# Patient Record
Sex: Male | Born: 1955 | Race: White | Hispanic: No | Marital: Married | Smoking: Never smoker
Health system: Southern US, Community
[De-identification: ages and names within clinical notes are randomized; demographics above are authoritative.]

## PROBLEM LIST (undated history)

## (undated) DIAGNOSIS — R000 Tachycardia, unspecified: Secondary | ICD-10-CM

## (undated) DIAGNOSIS — I429 Cardiomyopathy, unspecified: Secondary | ICD-10-CM

## (undated) DIAGNOSIS — R011 Cardiac murmur, unspecified: Secondary | ICD-10-CM

## (undated) DIAGNOSIS — T8859XA Other complications of anesthesia, initial encounter: Secondary | ICD-10-CM

## (undated) DIAGNOSIS — I10 Essential (primary) hypertension: Secondary | ICD-10-CM

## (undated) DIAGNOSIS — I2510 Atherosclerotic heart disease of native coronary artery without angina pectoris: Secondary | ICD-10-CM

## (undated) DIAGNOSIS — Z955 Presence of coronary angioplasty implant and graft: Secondary | ICD-10-CM

## (undated) DIAGNOSIS — I493 Ventricular premature depolarization: Secondary | ICD-10-CM

## (undated) HISTORY — PX: HEMORRHOID SURGERY: SHX153

---

## 2006-09-19 ENCOUNTER — Ambulatory Visit: Payer: Self-pay | Admitting: General Surgery

## 2006-09-25 ENCOUNTER — Ambulatory Visit: Payer: Self-pay | Admitting: General Surgery

## 2006-10-03 ENCOUNTER — Emergency Department: Payer: Self-pay | Admitting: General Surgery

## 2013-04-01 DIAGNOSIS — Z86018 Personal history of other benign neoplasm: Secondary | ICD-10-CM

## 2013-04-01 HISTORY — DX: Personal history of other benign neoplasm: Z86.018

## 2013-04-01 HISTORY — PX: HERNIA REPAIR: SHX51

## 2013-04-27 ENCOUNTER — Ambulatory Visit: Payer: Self-pay | Admitting: Surgery

## 2013-04-27 LAB — BASIC METABOLIC PANEL
ANION GAP: 4 — AB (ref 7–16)
BUN: 16 mg/dL (ref 7–18)
CHLORIDE: 107 mmol/L (ref 98–107)
CO2: 29 mmol/L (ref 21–32)
Calcium, Total: 8.8 mg/dL (ref 8.5–10.1)
Creatinine: 1 mg/dL (ref 0.60–1.30)
EGFR (African American): >60
EGFR (Non-African Amer.): >60
Glucose: 93 mg/dL (ref 65–99)
OSMOLALITY: 280 (ref 275–301)
Potassium: 3.7 mmol/L (ref 3.5–5.1)
Sodium: 140 mmol/L (ref 136–145)

## 2013-04-27 LAB — CBC
HCT: 42.2 % (ref 40.0–52.0)
HGB: 14.6 g/dL (ref 13.0–18.0)
MCH: 31.3 pg (ref 26.0–34.0)
MCHC: 34.5 g/dL (ref 32.0–36.0)
MCV: 91 fL (ref 80–100)
Platelet: 134 10*3/uL — ABNORMAL LOW (ref 150–440)
RBC: 4.66 10*6/uL (ref 4.40–5.90)
RDW: 12.8 % (ref 11.5–14.5)
WBC: 4.4 10*3/uL (ref 3.8–10.6)

## 2013-05-03 ENCOUNTER — Ambulatory Visit: Payer: Self-pay | Admitting: Surgery

## 2013-05-03 LAB — CBC WITH DIFFERENTIAL/PLATELET
BASOS ABS: 0 10*3/uL (ref 0.0–0.1)
Basophil %: 0.8 %
EOS ABS: 0.1 10*3/uL (ref 0.0–0.7)
EOS PCT: 1.2 %
HCT: 41.5 % (ref 40.0–52.0)
HGB: 14 g/dL (ref 13.0–18.0)
Lymphocyte #: 1.4 10*3/uL (ref 1.0–3.6)
Lymphocyte %: 26.3 %
MCH: 30.7 pg (ref 26.0–34.0)
MCHC: 33.8 g/dL (ref 32.0–36.0)
MCV: 91 fL (ref 80–100)
Monocyte #: 0.5 x10 3/mm (ref 0.2–1.0)
Monocyte %: 8.9 %
Neutrophil #: 3.4 10*3/uL (ref 1.4–6.5)
Neutrophil %: 62.8 %
Platelet: 145 10*3/uL — ABNORMAL LOW (ref 150–440)
RBC: 4.58 10*6/uL (ref 4.40–5.90)
RDW: 12.9 % (ref 11.5–14.5)
WBC: 5.4 10*3/uL (ref 3.8–10.6)

## 2013-05-03 LAB — BASIC METABOLIC PANEL
Anion Gap: 5 — ABNORMAL LOW (ref 7–16)
BUN: 14 mg/dL (ref 7–18)
CALCIUM: 8.2 mg/dL — AB (ref 8.5–10.1)
CHLORIDE: 108 mmol/L — AB (ref 98–107)
CO2: 27 mmol/L (ref 21–32)
Creatinine: 1.17 mg/dL (ref 0.60–1.30)
EGFR (Non-African Amer.): >60
GLUCOSE: 87 mg/dL (ref 65–99)
OSMOLALITY: 279 (ref 275–301)
POTASSIUM: 3.9 mmol/L (ref 3.5–5.1)
Sodium: 140 mmol/L (ref 136–145)

## 2013-05-03 LAB — TSH: Thyroid Stimulating Horm: 1.12 u[IU]/mL

## 2014-02-17 ENCOUNTER — Emergency Department: Payer: Self-pay | Admitting: Emergency Medicine

## 2014-07-23 NOTE — Consult Note (Signed)
PATIENT NAME:  Jose Mckenzie, Jose Mckenzie MR#:  [id number] DATE OF BIRTH:  04/01/1956  DATE OF CONSULTATION:  05/03/2013  REFERRING PHYSICIAN:  Rochel Brome, MD CONSULTING PHYSICIAN:  Zadin Lange D. Clayborn Bigness, MD  PRIMARY CARE PHYSICIAN: Juluis Pitch, MD  INDICATION: Arrhythmia, palpitations, multifocal PVCs, postop from hernia repair.   HISTORY OF PRESENT ILLNESS: The patient is a 59 year old white male with no significant past medical history. He has had hemorrhoids in the past. Recently was found to have a hernia. He underwent hernia repair with Dr. Rochel Brome; it was uneventful. Postoperatively he had what was found to be multifocal PVCs, numbering multiple. The patient denied any chest pain or palpitations. No recent blackout spells or weakness. He has had no significant cardiac history. Reportedly, he has had a Holter in the past which just showed significant PVCs. He has been in some pain postoperatively and with the anesthesia had some significant worsening PVCs. He had a stress test done back in June of 2013 which looked okay. The patient now feels fine postoperatively and is asymptomatic. Telemetry with occasional PVCs.   FAMILY HISTORY: Negative except for diabetes, hypertension, and prostate cancer.   SOCIAL HISTORY: Married. Lives with his wife. One child. Works as a Pharmacist, hospital at [employer]. In [place] and retired from the [employer]. Denies smoking or alcohol consumption.   PAST MEDICAL HISTORY: Hemorrhoids, otherwise negative.   PAST SURGICAL HISTORY: Hemorrhoidectomy.  REVIEW OF SYSTEMS: Essentially negative. No blackout spells or syncope. No nausea or vomiting. No fever. No chills or sweats. No weight loss. No weight gain, hemoptysis, or hematemesis. No bright red blood per rectum.  PHYSICAL EXAMINATION: VITAL SIGNS: Blood pressure 120/75, pulse 65, respiratory rate 18, afebrile.  HEENT: Normocephalic, atraumatic. Pupils equal and reactive to light.  NECK: Supple. No significant JVD, bruits,  or adenopathy.  LUNGS: Clear to auscultation and percussion. No significant wheeze, rhonchi, or rale.  HEART: Regular rate and rhythm. He had a slight murmur on heart exam, systolic, along the left sternal border. ABDOMEN: Positive bowel sounds. No rebound, guarding, or tenderness.  Abdomen otherwise benign.  EXTREMITIES: Within normal limits.  NEUROLOGIC: Intact.  SKIN: Normal.   LABORATORY AND DIAGNOSTICS: Laboratories today showed normal MET-B, normal CBC, and TSH is also normal.   EKG: Normal sinus rhythm, occasional frequent PVCs, otherwise negative.   IMPRESSION: Multifocal premature ventricular complexes postoperative from hernia surgery, mild pain, murmur.   PLAN: Will allow the patient to be discharged home. He got 2.5 Lopressor to see that would help the premature ventricular complexes. I do not recommend any direct medication changes at this point. Will not send him home on a beta blocker. Would consider aspirin once a day, 81 mg, for primary prevention. Would allow the patient to recover from his groin surgery. Would have him follow up in the office in a couple of weeks and then consider Holter monitor. Would also consider echocardiogram to be sure that the murmur is not anything significant. Would treat the patient conservatively and medically for now.   ____________________________ Loran Senters. Clayborn Bigness, MD ddc:sb D: 05/03/2013 12:18:57 ET T: 05/03/2013 13:47:23 ET JOB#: [id number]  cc: Draydon Clairmont D. Clayborn Bigness, MD, <Dictator> Yolonda Kida MD ELECTRONICALLY SIGNED 06/14/2013 7:48

## 2014-07-23 NOTE — Consult Note (Signed)
Brief Consult Note: Diagnosis: PVCs Palpitations Post op.   Patient was seen by consultant.   Consult note dictated.   Recommend to proceed with surgery or procedure.   Orders entered.   Discussed with Attending MD.   Comments: IMP PVCs Palpitions Murmur Post-op hernia . PLan Low dose Metoprolol IV ASA 81mg  QD Holter 24hr as outpt ECHO as outpt for murmur I do not rec ETT at this point F/U cardiology 1-2 weeks Ok to D/C home today.  Electronic Signatures: Jose Mckenzie, Keldric Poyer D (MD)  (Signed 02-Feb-15 12:27)  Authored: Brief Consult Note   Last Updated: 02-Feb-15 12:27 by Jose Mckenzie, Jose Mckenzie D (MD)

## 2014-07-23 NOTE — Op Note (Signed)
PATIENT NAME:  Jose Mckenzie, Elwyn MR#:  [id number] DATE OF BIRTH:  Aug 12, 1955  DATE OF PROCEDURE:  05/03/2013  PREOPERATIVE DIAGNOSIS: Right inguinal hernia.   POSTOPERATIVE DIAGNOSIS: Right inguinal hernia.   PROCEDURE: Right inguinal hernia repair.   SURGEON: Renda RollsWilton Alicianna Litchford, M.D.   ANESTHESIA: General.   INDICATIONS: This 59 year old male has had bulging in the right groin which resolves with being in the supine position. Did feel an impulse at this site on exam, making the diagnosis of right inguinal hernia and surgery was recommended for definitive treatment.   DESCRIPTION OF PROCEDURE: The patient was placed on the operating table in the supine position under general endotracheal anesthesia. The abdomen was prepared with ChloraPrep and draped in a sterile manner.   A right lower quadrant transversely oriented suprapubic incision was made, carried down through the subcutaneous tissues. One traversing vein was divided between 4-0 chromic suture ligatures. Scarpa's fascia was incised. The external ring was identified. The external oblique aponeurosis was incised along the course of its fibers to open the external ring and expose the inguinal cord structures. The ilioinguinal nerve was seen coursing medially and was dissected briefly to further move it medially. The cord structures were mobilized. There was just a small defect on the floor of the inguinal canal. The cremaster fibers were spread to expose an indirect hernia sac, which was dissected free from surrounding structures. The sac was approximately 4 cm in length. It was opened. Its continuity with the peritoneal cavity was demonstrated. A high ligation of the sac was done with 4-0 Vicryl suture ligature, and the sac was excised. I did not elect to send it for pathology. Next, a small, direct component was imbricated with 0 Surgilon simple sutures. Next, an onlay Bard soft polypropylene mesh was cut to create an oval shape of some 2.5 x 3.5 cm with  a notch cut out to straddle the internal ring. This was sutured to the deep fascia and did cover the small direct defect and was sutured to the shelving edge of the inguinal ligament and also sutured medially to the fascia but just lateral to the course of the ilioinguinal nerve. The repair looked good. Hemostasis was intact. Cord structures were replaced along the floor of the inguinal canal. Cut edges of the external oblique aponeurosis were closed with a running 4-0 Vicryl suture to recreate the external ring. The deep fascia superior and lateral to the repair site was infiltrated with 0.5% Sensorcaine with epinephrine. The subcutaneous tissues were infiltrated as well. The Scarpa's fascia was closed with interrupted 4-0 Vicryl. The skin was closed with running 5-0 Monocryl subcuticular suture and Dermabond. The testicle remained in the scrotum. The patient tolerated surgery satisfactorily and was then prepared for transfer to the recovery room.     ____________________________ Shela CommonsJ. Renda RollsWilton Kayleanna Lorman, MD jws:dmm D: 05/03/2013 09:07:42 ET T: 05/03/2013 11:45:52 ET JOB#: [id number]  cc: Adella HareJ. Wilton Dorothye Berni, MD, <Dictator> Adella HareWILTON J Evagelia Knack MD ELECTRONICALLY SIGNED 05/04/2013 9:18

## 2021-10-15 ENCOUNTER — Other Ambulatory Visit
Admission: RE | Admit: 2021-10-15 | Discharge: 2021-10-15 | Disposition: A | Discharge status: Home / Self Care | Payer: BC Managed Care – PPO | Source: Ambulatory Visit | Attending: Internal Medicine | Admitting: Internal Medicine

## 2021-10-15 DIAGNOSIS — R0602 Shortness of breath: Secondary | ICD-10-CM | POA: Diagnosis not present

## 2021-10-15 DIAGNOSIS — I493 Ventricular premature depolarization: Secondary | ICD-10-CM | POA: Diagnosis not present

## 2021-10-15 DIAGNOSIS — I447 Left bundle-branch block, unspecified: Secondary | ICD-10-CM | POA: Insufficient documentation

## 2021-10-15 DIAGNOSIS — R002 Palpitations: Secondary | ICD-10-CM | POA: Diagnosis present

## 2021-10-15 LAB — BRAIN NATRIURETIC PEPTIDE: B Natriuretic Peptide: 28.8 pg/mL (ref 0.0–100.0)

## 2021-11-01 ENCOUNTER — Other Ambulatory Visit: Payer: Self-pay

## 2021-11-01 ENCOUNTER — Encounter
Admission: RE | Disposition: A | Discharge status: Home / Self Care | Payer: Self-pay | Source: Home / Self Care | Attending: Internal Medicine

## 2021-11-01 ENCOUNTER — Encounter: Payer: Self-pay | Admitting: Internal Medicine

## 2021-11-01 ENCOUNTER — Observation Stay
Admission: RE | Admit: 2021-11-01 | Discharge: 2021-11-03 | Disposition: A | Discharge status: Home / Self Care | Payer: BC Managed Care – PPO | Attending: Internal Medicine | Admitting: Internal Medicine

## 2021-11-01 DIAGNOSIS — I255 Ischemic cardiomyopathy: Secondary | ICD-10-CM | POA: Diagnosis not present

## 2021-11-01 DIAGNOSIS — I10 Essential (primary) hypertension: Secondary | ICD-10-CM | POA: Insufficient documentation

## 2021-11-01 DIAGNOSIS — Z955 Presence of coronary angioplasty implant and graft: Secondary | ICD-10-CM

## 2021-11-01 DIAGNOSIS — I771 Stricture of artery: Secondary | ICD-10-CM | POA: Insufficient documentation

## 2021-11-01 DIAGNOSIS — I25110 Atherosclerotic heart disease of native coronary artery with unstable angina pectoris: Secondary | ICD-10-CM | POA: Diagnosis present

## 2021-11-01 DIAGNOSIS — I447 Left bundle-branch block, unspecified: Secondary | ICD-10-CM | POA: Diagnosis not present

## 2021-11-01 DIAGNOSIS — R9430 Abnormal result of cardiovascular function study, unspecified: Secondary | ICD-10-CM

## 2021-11-01 DIAGNOSIS — E785 Hyperlipidemia, unspecified: Secondary | ICD-10-CM | POA: Insufficient documentation

## 2021-11-01 HISTORY — PX: LEFT HEART CATH AND CORONARY ANGIOGRAPHY: CATH118249

## 2021-11-01 HISTORY — PX: CORONARY STENT INTERVENTION: CATH118234

## 2021-11-01 LAB — POCT ACTIVATED CLOTTING TIME: Activated Clotting Time: 287 seconds

## 2021-11-01 SURGERY — LEFT HEART CATH AND CORONARY ANGIOGRAPHY
Anesthesia: Moderate Sedation

## 2021-11-01 MED ORDER — ASPIRIN 81 MG PO CHEW
CHEWABLE_TABLET | ORAL | Status: AC
  Administered 2021-11-01: 81 mg via ORAL
  Filled 2021-11-01: qty 1

## 2021-11-01 MED ORDER — LIDOCAINE HCL 1 % IJ SOLN
INTRAMUSCULAR | Status: AC
  Filled 2021-11-01: qty 20

## 2021-11-01 MED ORDER — SPIRONOLACTONE 25 MG PO TABS
12.5000 mg | ORAL_TABLET | Freq: Every day | ORAL | Status: DC
  Administered 2021-11-01 – 2021-11-03 (×3): 12.5 mg via ORAL
  Filled 2021-11-01: qty 1
  Filled 2021-11-01 (×3): qty 0.5
  Filled 2021-11-01: qty 1

## 2021-11-01 MED ORDER — SODIUM CHLORIDE 0.9% FLUSH
3.0000 mL | Freq: Two times a day (BID) | INTRAVENOUS | Status: DC
  Administered 2021-11-02 – 2021-11-03 (×3): 3 mL via INTRAVENOUS

## 2021-11-01 MED ORDER — METOPROLOL SUCCINATE ER 50 MG PO TB24
ORAL_TABLET | ORAL | Status: AC
  Administered 2021-11-01: 25 mg via ORAL
  Filled 2021-11-01: qty 1

## 2021-11-01 MED ORDER — HYDRALAZINE HCL 20 MG/ML IJ SOLN
INTRAMUSCULAR | Status: AC
  Filled 2021-11-01: qty 1

## 2021-11-01 MED ORDER — ACETAMINOPHEN 325 MG PO TABS: 650.0000 mg | ORAL_TABLET | ORAL | Status: DC | PRN

## 2021-11-01 MED ORDER — LIDOCAINE HCL (PF) 1 % IJ SOLN
INTRAMUSCULAR | Status: DC | PRN
  Administered 2021-11-01: 3 mL

## 2021-11-01 MED ORDER — SODIUM CHLORIDE 0.9 % IV SOLN: 250.0000 mL | INTRAVENOUS | Status: DC | PRN

## 2021-11-01 MED ORDER — VERAPAMIL HCL 2.5 MG/ML IV SOLN
INTRAVENOUS | Status: AC
  Filled 2021-11-01: qty 2

## 2021-11-01 MED ORDER — MIDAZOLAM HCL 2 MG/2ML IJ SOLN
INTRAMUSCULAR | Status: DC | PRN
  Administered 2021-11-01: 1 mg via INTRAVENOUS

## 2021-11-01 MED ORDER — TICAGRELOR 90 MG PO TABS
ORAL_TABLET | ORAL | Status: AC
  Filled 2021-11-01: qty 2

## 2021-11-01 MED ORDER — VERAPAMIL HCL 2.5 MG/ML IV SOLN
INTRAVENOUS | Status: DC | PRN
  Administered 2021-11-01: 2.5 mg via INTRA_ARTERIAL

## 2021-11-01 MED ORDER — TICAGRELOR 90 MG PO TABS
ORAL_TABLET | ORAL | Status: DC | PRN
  Administered 2021-11-01: 180 mg via ORAL

## 2021-11-01 MED ORDER — ASPIRIN 81 MG PO CHEW
CHEWABLE_TABLET | ORAL | Status: AC
  Filled 2021-11-01: qty 4

## 2021-11-01 MED ORDER — HEPARIN SODIUM (PORCINE) 1000 UNIT/ML IJ SOLN
INTRAMUSCULAR | Status: AC
  Filled 2021-11-01: qty 10

## 2021-11-01 MED ORDER — IOHEXOL 300 MG/ML  SOLN
INTRAMUSCULAR | Status: DC | PRN
  Administered 2021-11-01: 400 mL

## 2021-11-01 MED ORDER — ASPIRIN 81 MG PO CHEW: 81.0000 mg | CHEWABLE_TABLET | ORAL | Status: AC

## 2021-11-01 MED ORDER — SODIUM CHLORIDE 0.9 % WEIGHT BASED INFUSION
1.0000 mL/kg/h | INTRAVENOUS | Status: AC
  Administered 2021-11-01: 1 mL/kg/h via INTRAVENOUS

## 2021-11-01 MED ORDER — HEPARIN SODIUM (PORCINE) 1000 UNIT/ML IJ SOLN
INTRAMUSCULAR | Status: DC | PRN
  Administered 2021-11-01: 3000 [IU] via INTRAVENOUS
  Administered 2021-11-01: 6000 [IU] via INTRAVENOUS
  Administered 2021-11-01: 4000 [IU] via INTRAVENOUS

## 2021-11-01 MED ORDER — TICAGRELOR 90 MG PO TABS
90.0000 mg | ORAL_TABLET | Freq: Two times a day (BID) | ORAL | Status: DC
  Administered 2021-11-02 – 2021-11-03 (×3): 90 mg via ORAL
  Filled 2021-11-01 (×3): qty 1

## 2021-11-01 MED ORDER — ONDANSETRON HCL 4 MG/2ML IJ SOLN: 4.0000 mg | Freq: Four times a day (QID) | INTRAMUSCULAR | Status: DC | PRN

## 2021-11-01 MED ORDER — MIDAZOLAM HCL 2 MG/2ML IJ SOLN
INTRAMUSCULAR | Status: AC
  Filled 2021-11-01: qty 2

## 2021-11-01 MED ORDER — LABETALOL HCL 5 MG/ML IV SOLN
10.0000 mg | INTRAVENOUS | Status: AC | PRN
Start: 2021-11-01 — End: 2021-11-02
  Administered 2021-11-01: 10 mg via INTRAVENOUS

## 2021-11-01 MED ORDER — HEPARIN (PORCINE) IN NACL 1000-0.9 UT/500ML-% IV SOLN
INTRAVENOUS | Status: DC | PRN
  Administered 2021-11-01: 1000 mL

## 2021-11-01 MED ORDER — FENTANYL CITRATE (PF) 100 MCG/2ML IJ SOLN
INTRAMUSCULAR | Status: AC
  Filled 2021-11-01: qty 2

## 2021-11-01 MED ORDER — SODIUM CHLORIDE 0.9% FLUSH: 3.0000 mL | INTRAVENOUS | Status: DC | PRN

## 2021-11-01 MED ORDER — METOPROLOL SUCCINATE ER 25 MG PO TB24
25.0000 mg | ORAL_TABLET | Freq: Every day | ORAL | Status: DC
  Administered 2021-11-02: 25 mg via ORAL
  Filled 2021-11-01 (×2): qty 1

## 2021-11-01 MED ORDER — SODIUM CHLORIDE 0.9 % WEIGHT BASED INFUSION
3.0000 mL/kg/h | INTRAVENOUS | Status: AC
  Administered 2021-11-01: 3 mL/kg/h via INTRAVENOUS

## 2021-11-01 MED ORDER — ASPIRIN 81 MG PO CHEW
CHEWABLE_TABLET | ORAL | Status: DC | PRN
  Administered 2021-11-01: 243 mg via ORAL

## 2021-11-01 MED ORDER — SODIUM CHLORIDE 0.9 % WEIGHT BASED INFUSION
1.0000 mL/kg/h | INTRAVENOUS | Status: DC
Start: 2021-11-01 — End: 2021-11-01
  Administered 2021-11-01: 1 mL/kg/h via INTRAVENOUS

## 2021-11-01 MED ORDER — HYDRALAZINE HCL 20 MG/ML IJ SOLN
10.0000 mg | INTRAMUSCULAR | Status: AC | PRN
Start: 2021-11-01 — End: 2021-11-02
  Administered 2021-11-01: 10 mg via INTRAVENOUS

## 2021-11-01 MED ORDER — ASPIRIN 81 MG PO CHEW
81.0000 mg | CHEWABLE_TABLET | Freq: Every day | ORAL | Status: DC
  Administered 2021-11-02 – 2021-11-03 (×2): 81 mg via ORAL
  Filled 2021-11-01 (×2): qty 1

## 2021-11-01 MED ORDER — LABETALOL HCL 5 MG/ML IV SOLN
INTRAVENOUS | Status: AC
  Administered 2021-11-01: 10 mg via INTRAVENOUS
  Filled 2021-11-01: qty 4

## 2021-11-01 MED ORDER — LOSARTAN POTASSIUM 50 MG PO TABS
50.0000 mg | ORAL_TABLET | Freq: Every day | ORAL | Status: DC
  Administered 2021-11-01 – 2021-11-03 (×3): 50 mg via ORAL
  Filled 2021-11-01 (×3): qty 1

## 2021-11-01 MED ORDER — FENTANYL CITRATE (PF) 100 MCG/2ML IJ SOLN
INTRAMUSCULAR | Status: DC | PRN
  Administered 2021-11-01: 50 ug via INTRAVENOUS

## 2021-11-01 MED ORDER — ROSUVASTATIN CALCIUM 10 MG PO TABS
40.0000 mg | ORAL_TABLET | Freq: Every day | ORAL | Status: DC
  Administered 2021-11-01 – 2021-11-03 (×3): 40 mg via ORAL
  Filled 2021-11-01: qty 2
  Filled 2021-11-01 (×2): qty 4

## 2021-11-01 SURGICAL SUPPLY — 19 items
BALLN EUPHORA RX 2.0X12 (BALLOONS) ×2
BALLOON EUPHORA RX 2.0X12 (BALLOONS) IMPLANT
BAND ZEPHYR COMPRESS 30 LONG (HEMOSTASIS) ×1 IMPLANT
CATH 5FR JL3.5 JR4 ANG PIG MP (CATHETERS) ×1 IMPLANT
CATH VISTA GUIDE 6FR XB3.5 (CATHETERS) ×1 IMPLANT
DRAPE BRACHIAL (DRAPES) ×1 IMPLANT
GLIDESHEATH SLEND SS 6F .021 (SHEATH) ×1 IMPLANT
GUIDEWIRE INQWIRE 1.5J.035X260 (WIRE) IMPLANT
INQWIRE 1.5J .035X260CM (WIRE) ×2
KIT ENCORE 26 ADVANTAGE (KITS) ×1 IMPLANT
PACK CARDIAC CATH (CUSTOM PROCEDURE TRAY) ×2 IMPLANT
PROTECTION STATION PRESSURIZED (MISCELLANEOUS) ×2
SET ATX SIMPLICITY (MISCELLANEOUS) ×1 IMPLANT
STATION PROTECTION PRESSURIZED (MISCELLANEOUS) IMPLANT
STENT ONYX FRONTIER 2.0X15 (Permanent Stent) ×1 IMPLANT
STENT ONYX FRONTIER 2.5X08 (Permanent Stent) ×1 IMPLANT
STENT ONYX FRONTIER 2.5X18 (Permanent Stent) ×1 IMPLANT
TUBING CIL FLEX 10 FLL-RA (TUBING) ×1 IMPLANT
WIRE G HI TQ BMW 190 (WIRE) ×2 IMPLANT

## 2021-11-01 NOTE — Progress Notes (Signed)
Called Dr Juliann Pares and apprised of hematoma size and quality and pt's reports of "numbness" and discoloration to hand - pt fingers O2 sat 97% and blanches with return to color.  Orders are to keep band inflated to initial pressure (14 ml air) and in place on wrist, Dr Juliann Pares reported intention to come in and assess pt's wrist, priority is hemostasis.

## 2021-11-01 NOTE — Progress Notes (Signed)
Notified MD of pt's TR band bleeding when taking air out of it. TR band down to 11cc. Rn also made MD aware part blow the thumb is purplish. MD instructed Rn to place 13-14 cc of air back in TR and an monitor circulation, and to notify if any changes.

## 2021-11-01 NOTE — Progress Notes (Signed)
Spoke with Dr. Juliann Pares. Patient has maintained hemostasis for 30 minutes. Per Dr. Juliann Pares start deflating TR Band.

## 2021-11-02 DIAGNOSIS — I25110 Atherosclerotic heart disease of native coronary artery with unstable angina pectoris: Secondary | ICD-10-CM | POA: Diagnosis not present

## 2021-11-02 LAB — CBC
HCT: 41.7 % (ref 39.0–52.0)
Hemoglobin: 14.6 g/dL (ref 13.0–17.0)
MCH: 30.5 pg (ref 26.0–34.0)
MCHC: 35 g/dL (ref 30.0–36.0)
MCV: 87.2 fL (ref 80.0–100.0)
Platelets: 152 10*3/uL (ref 150–400)
RBC: 4.78 MIL/uL (ref 4.22–5.81)
RDW: 12.4 % (ref 11.5–15.5)
WBC: 6.3 10*3/uL (ref 4.0–10.5)
nRBC: 0 % (ref 0.0–0.2)

## 2021-11-02 LAB — BASIC METABOLIC PANEL
Anion gap: 5 (ref 5–15)
BUN: 16 mg/dL (ref 8–23)
CO2: 24 mmol/L (ref 22–32)
Calcium: 8.5 mg/dL — ABNORMAL LOW (ref 8.9–10.3)
Chloride: 111 mmol/L (ref 98–111)
Creatinine, Ser: 1 mg/dL (ref 0.61–1.24)
GFR, Estimated: >60 mL/min (ref >60)
Glucose, Bld: 92 mg/dL (ref 70–99)
Potassium: 3.6 mmol/L (ref 3.5–5.1)
Sodium: 140 mmol/L (ref 135–145)

## 2021-11-02 NOTE — TOC Initial Note (Signed)
Transition of Care [facility]) - Initial/Assessment Note    Patient Details  Name: Jose Mckenzie MRN: [id number] Date of Birth: July 06, 1955  Transition of Care [facility]) CM/SW Contact:    Truddie Hidden, RN Phone Number: 11/02/2021, 10:32 AM  Clinical Narrative:                  Transition of Care [facility]) Screening Note   Patient Details  Name: Jose Mckenzie Date of Birth: 07/17/1955   Transition of Care [facility]) CM/SW Contact:    Truddie Hidden, RN Phone Number: 11/02/2021, 10:32 AM    Transition of Care Department [facility]) has reviewed patient and no TOC needs have been identified at this time. We will continue to monitor patient advancement through interdisciplinary progression rounds. If new patient transition needs arise, please place a TOC consult.          Patient Goals and CMS Choice        Expected Discharge Plan and Services                                                Prior Living Arrangements/Services                       Activities of Daily Living Home Assistive Devices/Equipment: None ADL Screening (condition at time of admission) Patient's cognitive ability adequate to safely complete daily activities?: Yes Is the patient deaf or have difficulty hearing?: No Does the patient have difficulty seeing, even when wearing glasses/contacts?: No Does the patient have difficulty concentrating, remembering, or making decisions?: No Patient able to express need for assistance with ADLs?: Yes Does the patient have difficulty dressing or bathing?: No Independently performs ADLs?: Yes (appropriate for developmental age) Does the patient have difficulty walking or climbing stairs?: No Weakness of Legs: None Weakness of Arms/Hands: None  Permission Sought/Granted                  Emotional Assessment              Admission diagnosis:  Status post insertion of drug eluting coronary artery stent [Z95.5] Patient Active Problem List   Diagnosis  Date Noted   Status post insertion of drug eluting coronary artery stent 11/01/2021   PCP:  Pcp, No Pharmacy:  No Pharmacies Listed    Social Determinants of Health (SDOH) Interventions    Readmission Risk Interventions     No data to display

## 2021-11-02 NOTE — Progress Notes (Signed)
[facility]    SUBJECTIVE: Patient doing much better right wrist is improving still some bruising no bleeding no numbness good sensation good capillary refill normal strength no chest pain no shortness of breath feels much improved   Vitals:   11/02/21 0800 11/02/21 0900 11/02/21 1130 11/02/21 1545  BP: (!) 145/91 (!) 137/91 135/83 130/88  Pulse: (!) 58 (!) 59 60 (!) 55  Resp: 13  20 19   Temp: 98.8 F (37.1 C)  98.7 F (37.1 C) 98 F (36.7 C)  TempSrc: Oral  Oral Oral  SpO2: 98% 99% 100% 97%  Weight:      Height:         Intake/Output Summary (Last 24 hours) at 11/02/2021 1923 Last data filed at 11/02/2021 1610 Gross per 24 hour  Intake 240 ml  Output 2625 ml  Net -2385 ml      PHYSICAL EXAM  General: Well developed, well nourished, in no acute distress HEENT:  Normocephalic and atramatic Neck:  No JVD.  Lungs: Clear bilaterally to auscultation and percussion. Heart: HRRR . Normal S1 and S2 without gallops or murmurs.  Abdomen: Bowel sounds are positive, abdomen soft and non-tender  Msk:  Back normal, normal gait. Normal strength and tone for age. Extremities: No clubbing, cyanosis or edema.   Neuro: Alert and oriented X 3. Psych:  Good affect, responds appropriately   LABS: Basic Metabolic Panel: Recent Labs    11/02/21 0556  NA 140  K 3.6  CL 111  CO2 24  GLUCOSE 92  BUN 16  CREATININE 1.00  CALCIUM 8.5*   Liver Function Tests: No results for input(s): "AST", "ALT", "ALKPHOS", "BILITOT", "PROT", "ALBUMIN" in the last 72 hours. No results for input(s): "LIPASE", "AMYLASE" in the last 72 hours. CBC: Recent Labs    11/02/21 0556  WBC 6.3  HGB 14.6  HCT 41.7  MCV 87.2  PLT 152   Cardiac Enzymes: No results for input(s): "CKTOTAL", "CKMB", "CKMBINDEX", "TROPONINI" in the last 72 hours. BNP: Invalid input(s): "POCBNP" D-Dimer: No results for input(s): "DDIMER" in the last 72 hours. Hemoglobin A1C: No results for input(s): "HGBA1C" in the last  72 hours. Fasting Lipid Panel: No results for input(s): "CHOL", "HDL", "LDLCALC", "TRIG", "CHOLHDL", "LDLDIRECT" in the last 72 hours. Thyroid Function Tests: No results for input(s): "TSH", "T4TOTAL", "T3FREE", "THYROIDAB" in the last 72 hours.  Invalid input(s): "FREET3" Anemia Panel: No results for input(s): "VITAMINB12", "FOLATE", "FERRITIN", "TIBC", "IRON", "RETICCTPCT" in the last 72 hours.  No results found.   Echo pending  TELEMETRY: Telemetry independently reviewed by me suggests sinus bradycardia rate around 55 left bundle branch block:  ASSESSMENT AND PLAN:  Principal Problem:   Status post insertion of drug eluting coronary artery stent Coronary artery disease Hypertension Right wrist hematoma Hyperlipidemia Mild ischemic cardiomyopathy    Plan Continue to observe overnight because of his right wrist bleeding and hematoma Continue Brilinta aspirin post PCI and stent for 12 months Hypertension control hopefully with ARB and beta-blocker consider spironolactone Recommend cardiac rehab Follow-up left ventricular function in 90 days after PCI and stent Increase activity have the patient ambulate in the halls Anticipate discharge in the morning  01/02/22, MD 11/02/2021 7:23 PM

## 2021-11-03 ENCOUNTER — Other Ambulatory Visit: Payer: Self-pay | Admitting: Internal Medicine

## 2021-11-03 DIAGNOSIS — I25110 Atherosclerotic heart disease of native coronary artery with unstable angina pectoris: Secondary | ICD-10-CM | POA: Diagnosis not present

## 2021-11-03 LAB — LIPOPROTEIN A (LPA): Lipoprotein (a): 23.9 nmol/L (ref <75.0)

## 2021-11-03 MED ORDER — ROSUVASTATIN CALCIUM 40 MG PO TABS: 40.0000 mg | ORAL_TABLET | Freq: Every day | ORAL | 0 refills | Status: DC

## 2021-11-03 MED ORDER — METOPROLOL SUCCINATE ER 25 MG PO TB24: 25.0000 mg | ORAL_TABLET | Freq: Every day | ORAL | 11 refills | Status: AC

## 2021-11-03 MED ORDER — ASPIRIN 81 MG PO TBEC: 81.0000 mg | DELAYED_RELEASE_TABLET | Freq: Every day | ORAL | 3 refills | Status: AC

## 2021-11-03 MED ORDER — LOSARTAN POTASSIUM 50 MG PO TABS: 50.0000 mg | ORAL_TABLET | Freq: Every day | ORAL | 3 refills | Status: DC

## 2021-11-03 MED ORDER — SPIRONOLACTONE 25 MG PO TABS: 12.5000 mg | ORAL_TABLET | Freq: Every day | ORAL | 3 refills | Status: DC

## 2021-11-03 MED ORDER — METOPROLOL SUCCINATE ER 25 MG PO TB24: 25.0000 mg | ORAL_TABLET | Freq: Every day | ORAL | 0 refills | Status: DC

## 2021-11-03 MED ORDER — TICAGRELOR 90 MG PO TABS: 90.0000 mg | ORAL_TABLET | Freq: Two times a day (BID) | ORAL | 0 refills | Status: DC

## 2021-11-03 MED ORDER — TICAGRELOR 90 MG PO TABS: 90.0000 mg | ORAL_TABLET | Freq: Two times a day (BID) | ORAL | 11 refills | Status: DC

## 2021-11-03 MED ORDER — METOPROLOL SUCCINATE ER 25 MG PO TB24: 25.0000 mg | ORAL_TABLET | Freq: Every day | ORAL | 11 refills | Status: DC

## 2021-11-03 MED ORDER — ROSUVASTATIN CALCIUM 20 MG PO TABS: 40.0000 mg | ORAL_TABLET | Freq: Every day | ORAL | 3 refills | Status: DC

## 2021-11-03 MED ORDER — LOSARTAN POTASSIUM 50 MG PO TABS: 50.0000 mg | ORAL_TABLET | Freq: Every day | ORAL | 0 refills | Status: DC

## 2021-11-03 MED ORDER — LOSARTAN POTASSIUM 50 MG PO TABS: 50.0000 mg | ORAL_TABLET | Freq: Every day | ORAL | 11 refills | Status: AC

## 2021-11-03 MED ORDER — ROSUVASTATIN CALCIUM 20 MG PO TABS: 40.0000 mg | ORAL_TABLET | Freq: Every day | ORAL | 11 refills | Status: AC

## 2021-11-03 MED ORDER — ASPIRIN 81 MG PO CHEW: 81.0000 mg | CHEWABLE_TABLET | Freq: Every day | ORAL | 0 refills | Status: DC

## 2021-11-03 MED ORDER — ASPIRIN 81 MG PO TBEC: 81.0000 mg | DELAYED_RELEASE_TABLET | Freq: Every day | ORAL | 3 refills | Status: DC

## 2021-11-03 MED ORDER — SPIRONOLACTONE 25 MG PO TABS: 12.5000 mg | ORAL_TABLET | Freq: Every day | ORAL | 0 refills | Status: DC

## 2021-11-03 NOTE — Discharge Summary (Signed)
Physician Discharge Summary      Patient ID: Jose Mckenzie MRN: [id number] DOB/AGE: 1955-12-11 66 y.o.  Admit date: 11/01/2021 Discharge date: 11/03/2021  Primary Discharge Diagnosis unstable angina Secondary Discharge Diagnosis multivessel coronary disease status post PCI and stent x2  Significant Diagnostic Studies: Cardiac cath PCI and stent  DES stent to mid LAD x2 DES stent to ramus  Consults:   Hospital Course: Patient initially presented as an outpatient with unstable angina diagnostic cardiac cath he underwent procedure right radial approach was found to have mildly depressed left ventricular function EF around 45% patient was found to have multivessel coronary disease and ramus and LAD where he received 2 DES stents in the LAD and 1 DES stent in the ramus with good results.  The patient was treated with heparin Brilinta aspirin had a TR band in the right wrist but there was some postprocedural bleeding requiring him to maintain the TR band overnight before was subsequently removed patient's had some mild ecchymosis under the skin with good function normal pulse.  Patient denies any chest pain feels reasonably well.  Because of his wrist it required him to stay an extra day for evaluation and management   Discharge Exam: Blood pressure 126/85, pulse (!) 55, temperature 97.8 F (36.6 C), temperature source Oral, resp. rate 18, height 5\' 9"  (1.753 m), weight 80.7 kg, SpO2 95 %.    General appearance: appears stated age Resp: clear to auscultation bilaterally Cardio: regular rate and rhythm, S1, S2 normal, no murmur, click, rub or gallop Extremities: extremities normal, atraumatic, no cyanosis or edema Pulses: 2+ and symmetric Neurologic: Alert and oriented X 3, normal strength and tone. Normal symmetric reflexes. Normal coordination and gait Right radial area significant healing hematoma ecchymosis good pulse good function healing well Labs:   Lab Results  Component Value  Date   WBC 6.3 11/02/2021   HGB 14.6 11/02/2021   HCT 41.7 11/02/2021   MCV 87.2 11/02/2021   PLT 152 11/02/2021    Recent Labs  Lab 11/02/21 0556  NA 140  K 3.6  CL 111  CO2 24  BUN 16  CREATININE 1.00  CALCIUM 8.5*  GLUCOSE 92      Radiology:  EKG: Sinus bradycardia left bundle branch block rate of 55 nonspecific ST-T wave changes  FOLLOW UP PLANS AND APPOINTMENTS Discharge Instructions     AMB Referral to Cardiac Rehabilitation - Phase II   Complete by: As directed    Diagnosis: Coronary Stents   After initial evaluation and assessments completed: Virtual Based Care may be provided alone or in conjunction with Phase 2 Cardiac Rehab based on patient barriers.: Yes      Allergies as of 11/03/2021   No Known Allergies      Medication List     TAKE these medications    aspirin 81 MG chewable tablet Chew 1 tablet (81 mg total) by mouth daily. Start taking on: November 04, 2021   losartan 50 MG tablet Commonly known as: COZAAR Take 1 tablet (50 mg total) by mouth daily. Start taking on: November 04, 2021   metoprolol succinate 25 MG 24 hr tablet Commonly known as: TOPROL-XL Take 1 tablet (25 mg total) by mouth daily. Start taking on: November 04, 2021   rosuvastatin 40 MG tablet Commonly known as: CRESTOR Take 1 tablet (40 mg total) by mouth daily. Start taking on: November 04, 2021   spironolactone 25 MG tablet Commonly known as: ALDACTONE Take 0.5 tablets (12.5 mg total)  by mouth daily. Start taking on: November 04, 2021   ticagrelor 90 MG Tabs tablet Commonly known as: BRILINTA Take 1 tablet (90 mg total) by mouth 2 (two) times daily.       Have the patient follow-up with cardiology 1 to 2 weeks   BRING ALL MEDICATIONS WITH YOU TO FOLLOW UP APPOINTMENTS  Time spent with patient to include physician time: 25 min Signed:  Alwyn Pea MD 11/03/2021, 11:14 AM

## 2021-11-05 ENCOUNTER — Encounter: Payer: Self-pay | Admitting: Internal Medicine

## 2021-11-11 ENCOUNTER — Encounter: Payer: Self-pay | Admitting: Emergency Medicine

## 2021-11-11 ENCOUNTER — Emergency Department
Admission: EM | Admit: 2021-11-11 | Discharge: 2021-11-11 | Disposition: A | Discharge status: Home / Self Care | Payer: BC Managed Care – PPO | Attending: Emergency Medicine | Admitting: Emergency Medicine

## 2021-11-11 ENCOUNTER — Other Ambulatory Visit: Payer: Self-pay

## 2021-11-11 ENCOUNTER — Emergency Department: Payer: BC Managed Care – PPO

## 2021-11-11 DIAGNOSIS — I447 Left bundle-branch block, unspecified: Secondary | ICD-10-CM | POA: Insufficient documentation

## 2021-11-11 DIAGNOSIS — M79601 Pain in right arm: Secondary | ICD-10-CM | POA: Diagnosis present

## 2021-11-11 DIAGNOSIS — S40021A Contusion of right upper arm, initial encounter: Secondary | ICD-10-CM

## 2021-11-11 DIAGNOSIS — I509 Heart failure, unspecified: Secondary | ICD-10-CM | POA: Diagnosis not present

## 2021-11-11 DIAGNOSIS — I2510 Atherosclerotic heart disease of native coronary artery without angina pectoris: Secondary | ICD-10-CM | POA: Diagnosis not present

## 2021-11-11 LAB — BASIC METABOLIC PANEL
Anion gap: 7 (ref 5–15)
BUN: 16 mg/dL (ref 8–23)
CO2: 24 mmol/L (ref 22–32)
Calcium: 8.6 mg/dL — ABNORMAL LOW (ref 8.9–10.3)
Chloride: 108 mmol/L (ref 98–111)
Creatinine, Ser: 1.09 mg/dL (ref 0.61–1.24)
GFR, Estimated: >60 mL/min (ref >60)
Glucose, Bld: 112 mg/dL — ABNORMAL HIGH (ref 70–99)
Potassium: 3.8 mmol/L (ref 3.5–5.1)
Sodium: 139 mmol/L (ref 135–145)

## 2021-11-11 LAB — CBC WITH DIFFERENTIAL/PLATELET
Abs Immature Granulocytes: 0.03 10*3/uL (ref 0.00–0.07)
Basophils Absolute: 0.1 10*3/uL (ref 0.0–0.1)
Basophils Relative: 1 %
Eosinophils Absolute: 0.2 10*3/uL (ref 0.0–0.5)
Eosinophils Relative: 3 %
HCT: 41.7 % (ref 39.0–52.0)
Hemoglobin: 14.5 g/dL (ref 13.0–17.0)
Immature Granulocytes: 1 %
Lymphocytes Relative: 24 %
Lymphs Abs: 1.5 10*3/uL (ref 0.7–4.0)
MCH: 30.5 pg (ref 26.0–34.0)
MCHC: 34.8 g/dL (ref 30.0–36.0)
MCV: 87.6 fL (ref 80.0–100.0)
Monocytes Absolute: 0.7 10*3/uL (ref 0.1–1.0)
Monocytes Relative: 12 %
Neutro Abs: 3.6 10*3/uL (ref 1.7–7.7)
Neutrophils Relative %: 59 %
Platelets: 189 10*3/uL (ref 150–400)
RBC: 4.76 MIL/uL (ref 4.22–5.81)
RDW: 11.9 % (ref 11.5–15.5)
WBC: 6 10*3/uL (ref 4.0–10.5)
nRBC: 0 % (ref 0.0–0.2)

## 2021-11-11 LAB — TROPONIN I (HIGH SENSITIVITY): Troponin I (High Sensitivity): 10 ng/L (ref <18)

## 2021-11-11 NOTE — ED Provider Notes (Signed)
Emergency department handoff note  Care of this patient was signed out to me at the end of the previous provider shift.  All pertinent patient information was conveyed and all questions were answered.  Patient pending ultrasound of the right upper extremity.  This ultrasound did show a hematoma tracking up the right upper extremity however no evidence of DVT at this time.  The patient has been reexamined and is ready to be discharged.  All diagnostic results have been reviewed and discussed with the patient/family.  Care plan has been outlined and the patient/family understands all current diagnoses, results, and treatment plans.  There are no new complaints, changes, or physical findings at this time.  All questions have been addressed and answered.  Patient was instructed to, and agrees to follow-up with their primary care physician as well as return to the emergency department if any new or worsening symptoms develop.   Merwyn Katos, MD 11/11/21 [phone]

## 2021-11-11 NOTE — ED Triage Notes (Signed)
Pt reports had a cardiac procedure done recently and for the past 3 days has had a pain and discomfort in his right. Pt concerned it may be a clot or something related. Denies CP

## 2021-11-11 NOTE — ED Provider Notes (Signed)
[facility] Provider Note    Event Date/Time   First MD Initiated Contact with Patient 11/11/21 1422     (approximate)   History   Chief Complaint Numbness   HPI  Jose Mckenzie is a 66 y.o. male with past medical history of CAD and CHF who presents to the ED complaining of arm pain.  Patient initially had cardiac catheterization performed 10 days ago with 2 stents placed in his LAD and one placed in the ramus.  He did require additional day of observation in the hospital due to bleeding from his right radial artery access site.  He has been doing well since then, but states he developed pain in his right arm about 3 days ago.  He typically wakes up in the morning with throbbing pain moving down his right arm.  He denies any numbness in the arm and has not had any difficulty moving the arm, states it does not worsen his pain to use the arm.  He has had some ecchymosis and swelling moving up his arm since the procedure, but states this is overall improving.  He denies any pain in his chest or difficulty breathing.     Physical Exam   Triage Vital Signs: ED Triage Vitals  Enc Vitals Group     BP 11/11/21 1415 (!) 175/104     Pulse Rate 11/11/21 1415 (!) 57     Resp 11/11/21 1415 16     Temp 11/11/21 1415 98.5 F (36.9 C)     Temp Source 11/11/21 1415 Oral     SpO2 11/11/21 1415 98 %     Weight 11/11/21 1405 179 lb (81.2 kg)     Height 11/11/21 1405 5\' 9"  (1.753 m)     Head Circumference --      Peak Flow --      Pain Score 11/11/21 1405 4     Pain Loc --      Pain Edu? --      Excl. in GC? --     Most recent vital signs: Vitals:   11/11/21 1430 11/11/21 1500  BP: (!) 167/102 138/89  Pulse: (!) 59 (!) 49  Resp: 18 16  Temp:    SpO2:      Constitutional: Alert and oriented. Eyes: Conjunctivae are normal. Head: Atraumatic. Nose: No congestion/rhinnorhea. Mouth/Throat: Mucous membranes are moist.  Cardiovascular: Normal rate, regular rhythm.  Grossly normal heart sounds.  2+ radial pulses bilaterally.  Cap refill less than 2 seconds in all digits of right hand. Respiratory: Normal respiratory effort.  No retractions. Lungs CTAB. Gastrointestinal: Soft and nontender. No distention. Musculoskeletal: No lower extremity tenderness nor edema.  Ecchymosis along ventral portion of right forearm tracking into medial upper arm with no significant hematoma or tenderness.  No erythema or warmth noted.  Range of motion intact throughout right arm without pain. Neurologic:  Normal speech and language. No gross focal neurologic deficits are appreciated.    ED Results / Procedures / Treatments   Labs (all labs ordered are listed, but only abnormal results are displayed) Labs Reviewed  BASIC METABOLIC PANEL - Abnormal; Notable for the following components:      Result Value   Glucose, Bld 112 (*)    Calcium 8.6 (*)    All other components within normal limits  CBC WITH DIFFERENTIAL/PLATELET  TROPONIN I (HIGH SENSITIVITY)     EKG  ED ECG REPORT I, 11/13/21, the attending physician, personally viewed and interpreted this  ECG.   Date: 11/11/2021  EKG Time: 14:15  Rate: 59  Rhythm: normal sinus rhythm  Axis: LAD  Intervals:left bundle branch block  ST&T Change: None  PROCEDURES:  Critical Care performed: No  Procedures   MEDICATIONS ORDERED IN ED: Medications - No data to display   IMPRESSION / MDM / ASSESSMENT AND PLAN / ED COURSE  I reviewed the triage vital signs and the nursing notes.                              66 y.o. male with past medical history of CAD and CHF who presents to the ED complaining of 3 days of pain moving up his right arm following recent access at right radial artery for cardiac catheterization.  Patient's presentation is most consistent with acute presentation with potential threat to life or bodily function.  Differential diagnosis includes, but is not limited to, ACS, hematoma,  abscess, arterial injury, DVT.  Patient well-appearing and in no acute distress, vital signs are unremarkable.  He is neurovascular intact distally to his right arm with neurologic and function intact, 2+ radial pulse noted along with cap refill less than 2 seconds in all digits.  Doubt cardiac etiology for his symptoms as EKG shows left bundle branch block similar to previous and troponin within normal limits.  Remainder of labs are reassuring with no significant anemia, leukocytosis, electrolyte abnormality, or AKI.  We will further assess with ultrasound of his right upper extremity to rule out DVT, but if this is unremarkable patient would be appropriate for discharge home with cardiology and PCP follow-up.  Patient turned over to oncoming provider pending ultrasound results.      FINAL CLINICAL IMPRESSION(S) / ED DIAGNOSES   Final diagnoses:  Right arm pain     Rx / DC Orders   ED Discharge Orders     None        Note:  This document was prepared using Dragon voice recognition software and may include unintentional dictation errors.   Chesley Noon, MD 11/11/21 1555

## 2021-12-14 ENCOUNTER — Encounter: Payer: BC Managed Care – PPO | Attending: Internal Medicine | Admitting: *Deleted

## 2021-12-14 ENCOUNTER — Encounter: Payer: Self-pay | Admitting: *Deleted

## 2021-12-14 DIAGNOSIS — Z48812 Encounter for surgical aftercare following surgery on the circulatory system: Secondary | ICD-10-CM | POA: Insufficient documentation

## 2021-12-14 DIAGNOSIS — Z955 Presence of coronary angioplasty implant and graft: Secondary | ICD-10-CM | POA: Insufficient documentation

## 2021-12-14 NOTE — Progress Notes (Signed)
Virtual orientation call completed today. he has an appointment on Date: 12/20/2021  for EP eval and gym Orientation.  Documentation of diagnosis can be found in [facility] Date: 11/01/2021 .

## 2021-12-20 ENCOUNTER — Encounter: Payer: BC Managed Care – PPO | Admitting: *Deleted

## 2021-12-20 VITALS — Ht 70.0 in | Wt 185.3 lb

## 2021-12-20 DIAGNOSIS — Z48812 Encounter for surgical aftercare following surgery on the circulatory system: Secondary | ICD-10-CM | POA: Diagnosis not present

## 2021-12-20 DIAGNOSIS — Z955 Presence of coronary angioplasty implant and graft: Secondary | ICD-10-CM | POA: Diagnosis present

## 2021-12-20 NOTE — Progress Notes (Signed)
Cardiac Individual Treatment Plan  Patient Details  Name: Jose Mckenzie MRN: [id number] Date of Birth: Sep 14, 1955 Referring Provider:   Flowsheet Row Cardiac Rehab from 12/20/2021 in [facility] Cardiac and Pulmonary Rehab  Referring Provider Dorothyann Peng MD       Initial Encounter Date:  Flowsheet Row Cardiac Rehab from 12/20/2021 in [facility] Cardiac and Pulmonary Rehab  Date 12/20/21       Visit Diagnosis: Status post coronary artery stent placement  Patient's Home Medications on Admission:  Current Outpatient Medications:    aspirin EC 81 MG tablet, Take 1 tablet (81 mg total) by mouth daily. Swallow whole., Disp: 90 tablet, Rfl: 3   losartan (COZAAR) 50 MG tablet, Take 1 tablet (50 mg total) by mouth daily., Disp: 30 tablet, Rfl: 11   metoprolol succinate (TOPROL XL) 25 MG 24 hr tablet, Take 1 tablet (25 mg total) by mouth daily., Disp: 30 tablet, Rfl: 11   rosuvastatin (CRESTOR) 20 MG tablet, Take 2 tablets (40 mg total) by mouth daily., Disp: 30 tablet, Rfl: 11   spironolactone (ALDACTONE) 25 MG tablet, Take 12.5 mg by mouth daily., Disp: , Rfl:    ticagrelor (BRILINTA) 90 MG TABS tablet, Take 1 tablet (90 mg total) by mouth 2 (two) times daily., Disp: 60 tablet, Rfl: 0  Past Medical History: Past Medical History:  Diagnosis Date   Hx of dysplastic nevus 2015   multiple sites    Tobacco Use: Social History   Tobacco Use  Smoking Status Never  Smokeless Tobacco Never    Labs: Review Flowsheet        No data to display           Exercise Target Goals: Exercise Program Goal: Individual exercise prescription set using results from initial 6 min walk test and THRR while considering  patient's activity barriers and safety.   Exercise Prescription Goal: Initial exercise prescription builds to 30-45 minutes a day of aerobic activity, 2-3 days per week.  Home exercise guidelines will be given to patient during program as part of exercise prescription that the  participant will acknowledge.   Education: Aerobic Exercise: - Group verbal and visual presentation on the components of exercise prescription. Introduces F.I.T.T principle from ACSM for exercise prescriptions.  Reviews F.I.T.T. principles of aerobic exercise including progression. Written material given at graduation.   Education: Resistance Exercise: - Group verbal and visual presentation on the components of exercise prescription. Introduces F.I.T.T principle from ACSM for exercise prescriptions  Reviews F.I.T.T. principles of resistance exercise including progression. Written material given at graduation.    Education: Exercise & Equipment Safety: - Individual verbal instruction and demonstration of equipment use and safety with use of the equipment. Flowsheet Row Cardiac Rehab from 12/20/2021 in [facility] Cardiac and Pulmonary Rehab  Date 12/20/21  Educator [facility]  Instruction Review Code 1- Verbalizes Understanding       Education: Exercise Physiology & General Exercise Guidelines: - Group verbal and written instruction with models to review the exercise physiology of the cardiovascular system and associated critical values. Provides general exercise guidelines with specific guidelines to those with heart or lung disease.    Education: Flexibility, Balance, Mind/Body Relaxation: - Group verbal and visual presentation with interactive activity on the components of exercise prescription. Introduces F.I.T.T principle from ACSM for exercise prescriptions. Reviews F.I.T.T. principles of flexibility and balance exercise training including progression. Also discusses the mind body connection.  Reviews various relaxation techniques to help reduce and manage stress (i.e. Deep breathing, progressive muscle relaxation, and  visualization). Balance handout provided to take home. Written material given at graduation.   Activity Barriers & Risk Stratification:  Activity Barriers & Cardiac Risk  Stratification - 12/20/21 1138       Activity Barriers & Cardiac Risk Stratification   Activity Barriers Back Problems;Deconditioning    Cardiac Risk Stratification Moderate             6 Minute Walk:  6 Minute Walk     Row Name 12/20/21 1137         6 Minute Walk   Phase Initial     Distance 1675 feet     Walk Time 6 minutes     # of Rest Breaks 0     MPH 3.17     METS 3.69     RPE 7     VO2 Peak 12.87     Symptoms No     Resting HR 57 bpm     Resting BP 128/64     Resting Oxygen Saturation  98 %     Exercise Oxygen Saturation  during 6 min walk 97 %     Max Ex. HR 93 bpm     Max Ex. BP 128/74     2 Minute Post BP 112/62              Oxygen Initial Assessment:   Oxygen Re-Evaluation:   Oxygen Discharge (Final Oxygen Re-Evaluation):   Initial Exercise Prescription:  Initial Exercise Prescription - 12/20/21 1100       Date of Initial Exercise RX and Referring Provider   Date 12/20/21    Referring Provider Dorothyann Peng MD      Oxygen   Maintain Oxygen Saturation 88% or higher      Treadmill   MPH 3.3    Grade 1    Minutes 15    METs 3.98      REL-XR   Level 3    Speed 50    Minutes 15    METs 3      T5 Nustep   Level 3    SPM 80    Minutes 15    METs 3      Track   Laps 42    Minutes 15    METs 3.28      Prescription Details   Frequency (times per week) 3    Duration Progress to 30 minutes of continuous aerobic without signs/symptoms of physical distress      Intensity   THRR 40-80% of Max Heartrate 95-133    Ratings of Perceived Exertion 11-13    Perceived Dyspnea 0-4      Progression   Progression Continue to progress workloads to maintain intensity without signs/symptoms of physical distress.      Resistance Training   Training Prescription Yes    Weight 5 lb    Reps 10-15             Perform Capillary Blood Glucose checks as needed.  Exercise Prescription Changes:   Exercise Prescription Changes      Row Name 12/20/21 1100             Response to Exercise   Blood Pressure (Admit) 128/64       Blood Pressure (Exercise) 128/74       Blood Pressure (Exit) 112/64       Heart Rate (Admit) 57 bpm       Heart Rate (Exercise) 93 bpm  Heart Rate (Exit) 68 bpm       Oxygen Saturation (Admit) 98 %       Oxygen Saturation (Exercise) 97 %       Rating of Perceived Exertion (Exercise) 7       Symptoms none       Comments walk test results                Exercise Comments:   Exercise Goals and Review:   Exercise Goals     Row Name 12/20/21 1143             Exercise Goals   Increase Physical Activity Yes       Intervention Provide advice, education, support and counseling about physical activity/exercise needs.;Develop an individualized exercise prescription for aerobic and resistive training based on initial evaluation findings, risk stratification, comorbidities and participant's personal goals.       Expected Outcomes Short Term: Attend rehab on a regular basis to increase amount of physical activity.;Long Term: Add in home exercise to make exercise part of routine and to increase amount of physical activity.;Long Term: Exercising regularly at least 3-5 days a week.       Increase Strength and Stamina Yes       Intervention Provide advice, education, support and counseling about physical activity/exercise needs.;Develop an individualized exercise prescription for aerobic and resistive training based on initial evaluation findings, risk stratification, comorbidities and participant's personal goals.       Expected Outcomes Short Term: Increase workloads from initial exercise prescription for resistance, speed, and METs.;Short Term: Perform resistance training exercises routinely during rehab and add in resistance training at home;Long Term: Improve cardiorespiratory fitness, muscular endurance and strength as measured by increased METs and functional capacity ( )        Able to understand and use rate of perceived exertion (RPE) scale Yes       Intervention Provide education and explanation on how to use RPE scale       Expected Outcomes Short Term: Able to use RPE daily in rehab to express subjective intensity level;Long Term:  Able to use RPE to guide intensity level when exercising independently       Able to understand and use Dyspnea scale Yes       Intervention Provide education and explanation on how to use Dyspnea scale       Expected Outcomes Short Term: Able to use Dyspnea scale daily in rehab to express subjective sense of shortness of breath during exertion;Long Term: Able to use Dyspnea scale to guide intensity level when exercising independently       Knowledge and understanding of Target Heart Rate Range (THRR) Yes       Intervention Provide education and explanation of THRR including how the numbers were predicted and where they are located for reference       Expected Outcomes Short Term: Able to state/look up THRR;Short Term: Able to use daily as guideline for intensity in rehab;Long Term: Able to use THRR to govern intensity when exercising independently       Able to check pulse independently Yes       Intervention Provide education and demonstration on how to check pulse in carotid and radial arteries.;Review the importance of being able to check your own pulse for safety during independent exercise       Expected Outcomes Long Term: Able to check pulse independently and accurately;Short Term: Able to explain why pulse checking is important during independent exercise  Understanding of Exercise Prescription Yes       Intervention Provide education, explanation, and written materials on patient's individual exercise prescription       Expected Outcomes Long Term: Able to explain home exercise prescription to exercise independently;Short Term: Able to explain program exercise prescription                Exercise Goals Re-Evaluation  :   Discharge Exercise Prescription (Final Exercise Prescription Changes):  Exercise Prescription Changes - 12/20/21 1100       Response to Exercise   Blood Pressure (Admit) 128/64    Blood Pressure (Exercise) 128/74    Blood Pressure (Exit) 112/64    Heart Rate (Admit) 57 bpm    Heart Rate (Exercise) 93 bpm    Heart Rate (Exit) 68 bpm    Oxygen Saturation (Admit) 98 %    Oxygen Saturation (Exercise) 97 %    Rating of Perceived Exertion (Exercise) 7    Symptoms none    Comments walk test results             Nutrition:  Target Goals: Understanding of nutrition guidelines, daily intake of sodium 1500mg , cholesterol 200mg , calories 30% from fat and 7% or less from saturated fats, daily to have 5 or more servings of fruits and vegetables.  Education: All About Nutrition: -Group instruction provided by verbal, written material, interactive activities, discussions, models, and posters to present general guidelines for heart healthy nutrition including fat, fiber, MyPlate, the role of sodium in heart healthy nutrition, utilization of the nutrition label, and utilization of this knowledge for meal planning. Follow up email sent as well. Written material given at graduation. Flowsheet Row Cardiac Rehab from 12/20/2021 in [facility] Cardiac and Pulmonary Rehab  Education need identified 12/20/21       Biometrics:  Pre Biometrics - 12/20/21 1143       Pre Biometrics   Height 5\' 10"  (1.778 m)    Weight 185 lb 4.8 oz (84.1 kg)    Waist Circumference 35.5 inches    Hip Circumference 39 inches    Waist to Hip Ratio 0.91 %    BMI (Calculated) 26.59    Single Leg Stand 30 seconds              Nutrition Therapy Plan and Nutrition Goals:  Nutrition Therapy & Goals - 12/20/21 1144       Intervention Plan   Intervention Prescribe, educate and counsel regarding individualized specific dietary modifications aiming towards targeted core components such as weight, hypertension, lipid  management, diabetes, heart failure and other comorbidities.    Expected Outcomes Short Term Goal: Understand basic principles of dietary content, such as calories, fat, sodium, cholesterol and nutrients.;Short Term Goal: A plan has been developed with personal nutrition goals set during dietitian appointment.;Long Term Goal: Adherence to prescribed nutrition plan.             Nutrition Assessments:  MEDIFICTS Score Key: ?70 Need to make dietary changes  40-70 Heart Healthy Diet ? 40 Therapeutic Level Cholesterol Diet  Flowsheet Row Cardiac Rehab from 12/20/2021 in [facility] Cardiac and Pulmonary Rehab  Picture Your Plate Total Score on Admission 85      Picture Your Plate Scores: [facility] Unhealthy dietary pattern with much room for improvement. 41-50 Dietary pattern unlikely to meet recommendations for good health and room for improvement. 51-60 More healthful dietary pattern, with some room for improvement.  >60 Healthy dietary pattern, although there may be some specific behaviors that could  be improved.    Nutrition Goals Re-Evaluation:   Nutrition Goals Discharge (Final Nutrition Goals Re-Evaluation):   Psychosocial: Target Goals: Acknowledge presence or absence of significant depression and/or stress, maximize coping skills, provide positive support system. Participant is able to verbalize types and ability to use techniques and skills needed for reducing stress and depression.   Education: Stress, Anxiety, and Depression - Group verbal and visual presentation to define topics covered.  Reviews how body is impacted by stress, anxiety, and depression.  Also discusses healthy ways to reduce stress and to treat/manage anxiety and depression.  Written material given at graduation.   Education: Sleep Hygiene -Provides group verbal and written instruction about how sleep can affect your health.  Define sleep hygiene, discuss sleep cycles and impact of sleep habits. Review good sleep  hygiene tips.    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 Yes   wife, friends     Barriers   Psychosocial barriers to participate in program There are no identifiable barriers or psychosocial needs.      Screening Interventions   Interventions Encouraged to exercise;To provide support and resources with identified psychosocial needs;Provide feedback about the scores to participant    Expected Outcomes Short Term goal: Utilizing psychosocial counselor, staff and physician to assist with identification of specific Stressors or current issues interfering with healing process. Setting desired goal for each stressor or current issue identified.;Long Term Goal: Stressors or current issues are controlled or eliminated.;Short Term goal: Identification and review with participant of any Quality of Life or Depression concerns found by scoring the questionnaire.;Long Term goal: The participant improves quality of Life and PHQ9 Scores as seen by post scores and/or verbalization of changes             Quality of Life Scores:   Quality of Life - 12/20/21 1144       Quality of Life   Select Quality of Life      Quality of Life Scores   Health/Function Pre 29.83 %    Socioeconomic Pre 30 %    Psych/Spiritual Pre 30 %    Family Pre 30 %    GLOBAL Pre 29.93 %            Scores of 19 and below usually indicate a poorer quality of life in these areas.  A difference of  2-3 points is a clinically meaningful difference.  A difference of 2-3 points in the total score of the Quality of Life Index has been associated with significant improvement in overall quality of life, self-image, physical symptoms, and general  health in studies assessing change in quality of life.  PHQ-9: Review Flowsheet       12/20/2021  Depression screen PHQ 2/9  Decreased Interest 0  Down, Depressed, Hopeless 0  PHQ - 2 Score 0  Altered sleeping 0  Tired, decreased energy 0  Change in appetite 0  Feeling bad or failure about yourself  0  Trouble concentrating 0  Moving slowly or fidgety/restless 0  Suicidal thoughts 0  PHQ-9 Score 0  Difficult doing work/chores Not difficult at all   Interpretation of Total Score  Total Score Depression Severity:  1-4 = Minimal depression, 5-9 = Mild depression, 10-14 = Moderate depression, 15-19 = Moderately severe depression, 20-27 = Severe depression   Psychosocial Evaluation and Intervention:  Psychosocial Evaluation - 12/14/21 1320       Psychosocial Evaluation & Interventions   Interventions Encouraged to exercise with the program and follow exercise prescription    Comments Emmit has no barriers to attending the program. 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 This can be long hours and time at home to keep the class running. He plans to retire in Dec this year. He lives with his wife and she and some friends are his support. He wants to be able to get back to doing all his previous activities. He stated that his doctor told hm he will reach that goal.    Expected Outcomes STG: Lane will be able to attend all scheduled sessions, he will progress with his exercise. LTG Vicki will continue his exercise progression after discharge    Continue Psychosocial Services  Follow up required by staff             Psychosocial Re-Evaluation:   Psychosocial Discharge (Final Psychosocial Re-Evaluation):   Vocational Rehabilitation: Provide vocational rehab assistance to qualifying candidates.   Vocational Rehab Evaluation & Intervention:  Vocational Rehab - 12/20/21 1145       Initial Vocational Rehab Evaluation & Intervention   Assessment shows  need for Vocational Rehabilitation No   Already returned to work            Education: Education Goals: Education classes will be provided on a variety of topics geared toward better understanding of heart health and risk factor modification. Participant will state understanding/return demonstration of topics presented as noted by education test scores.  Learning Barriers/Preferences:   General Cardiac Education Topics:  AED/CPR: - Group verbal and written instruction with the use of models to demonstrate the basic use of the AED with the basic ABC's of resuscitation.   Anatomy and Cardiac Procedures: - Group verbal and visual presentation and models provide information about basic cardiac anatomy and function. Reviews the testing methods done to diagnose heart disease and the outcomes of the test results. Describes the treatment choices: Medical Management, Angioplasty, or Coronary Bypass Surgery for treating various heart conditions including Myocardial Infarction, Angina, Valve Disease, and Cardiac Arrhythmias.  Written material given at graduation. Flowsheet Row Cardiac Rehab from 12/20/2021 in [facility] Cardiac and Pulmonary Rehab  Education need identified 12/20/21       Medication Safety: - Group verbal and visual instruction to review commonly prescribed medications for heart and lung disease. Reviews the medication, class of the drug, and side effects. Includes the steps to properly store meds and maintain the prescription regimen.  Written material given at graduation.   Intimacy: - Group verbal instruction through game format to discuss how heart and lung disease can affect sexual intimacy. Written material given at graduation..   Know Your Numbers and Heart Failure: - Group verbal and visual instruction to discuss disease risk factors for cardiac and pulmonary disease and treatment options.  Reviews associated critical values for Overweight/Obesity, Hypertension,  Cholesterol, and Diabetes.  Discusses basics of heart failure: signs/symptoms and treatments.  Introduces Heart Failure Zone chart for action plan for heart failure.  Written material given at graduation. Flowsheet Row Cardiac Rehab from 12/20/2021 in [facility] Cardiac and Pulmonary Rehab  Education need identified 12/20/21  Infection Prevention: - Provides verbal and written material to individual with discussion of infection control including proper hand washing and proper equipment cleaning during exercise session. Flowsheet Row Cardiac Rehab from 12/20/2021 in [facility] Cardiac and Pulmonary Rehab  Date 12/20/21  Educator [facility]  Instruction Review Code 1- Verbalizes Understanding       Falls Prevention: - Provides verbal and written material to individual with discussion of falls prevention and safety. Flowsheet Row Cardiac Rehab from 12/20/2021 in [facility] Cardiac and Pulmonary Rehab  Date 12/14/21  Educator SB  Instruction Review Code 1- Verbalizes Understanding       Other: -Provides group and verbal instruction on various topics (see comments)   Knowledge Questionnaire Score:  Knowledge Questionnaire Score - 12/20/21 1145       Knowledge Questionnaire Score   Pre Score 22/26             Core Components/Risk Factors/Patient Goals at Admission:  Personal Goals and Risk Factors at Admission - 12/20/21 1148       Core Components/Risk Factors/Patient Goals on Admission    Weight Management Yes;Weight Loss    Intervention Weight Management: Develop a combined nutrition and exercise program designed to reach desired caloric intake, while maintaining appropriate intake of nutrient and fiber, sodium and fats, and appropriate energy expenditure required for the weight goal.;Weight Management: Provide education and appropriate resources to help participant work on and attain dietary goals.    Admit Weight 185 lb 4.8 oz (84.1 kg)    Goal Weight: Short Term 180 lb (81.6 kg)    Goal  Weight: Long Term 174 lb (78.9 kg)    Expected Outcomes Short Term: Continue to assess and modify interventions until short term weight is achieved;Long Term: Adherence to nutrition and physical activity/exercise program aimed toward attainment of established weight goal;Weight Loss: Understanding of general recommendations for a balanced deficit meal plan, which promotes 1-2 lb weight loss per week and includes a negative energy balance of [phone] kcal/d;Understanding recommendations for meals to include 15-35% energy as protein, 25-35% energy from fat, 35-60% energy from carbohydrates, less than 200mg  of dietary cholesterol, 20-35 gm of total fiber daily;Understanding of distribution of calorie intake throughout the day with the consumption of 4-5 meals/snacks    Hypertension Yes    Intervention Provide education on lifestyle modifcations including regular physical activity/exercise, weight management, moderate sodium restriction and increased consumption of fresh fruit, vegetables, and low fat dairy, alcohol moderation, and smoking cessation.;Monitor prescription use compliance.    Expected Outcomes Short Term: Continued assessment and intervention until BP is < 140/20mm HG in hypertensive participants. < 130/22mm HG in hypertensive participants with diabetes, heart failure or chronic kidney disease.;Long Term: Maintenance of blood pressure at goal levels.    Lipids Yes    Intervention Provide education and support for participant on nutrition & aerobic/resistive exercise along with prescribed medications to achieve LDL 70mg , HDL >40mg .    Expected Outcomes Short Term: Participant states understanding of desired cholesterol values and is compliant with medications prescribed. Participant is following exercise prescription and nutrition guidelines.;Long Term: Cholesterol controlled with medications as prescribed, with individualized exercise RX and with personalized nutrition plan. Value goals: LDL <  70mg , HDL > 40 mg.             Education:Diabetes - Individual verbal and written instruction to review signs/symptoms of diabetes, desired ranges of glucose level fasting, after meals and with exercise. Acknowledge that pre and post exercise glucose checks will be done for 3 sessions  at entry of program.   Core Components/Risk Factors/Patient Goals Review:    Core Components/Risk Factors/Patient Goals at Discharge (Final Review):    ITP Comments:  ITP Comments     Row Name 12/14/21 1329 12/20/21 1136         ITP Comments Virtual orientation call completed today. he has an appointment on Date: 12/20/2021  for EP eval and gym Orientation.  Documentation of diagnosis can be found in [facility] Date: 11/01/2021 . Completed and gym orientation. Initial ITP created and sent for review to Dr. Bethann Punches, Medical Director.               Comments: Initial ITP

## 2021-12-20 NOTE — Patient Instructions (Addendum)
Patient Instructions  Patient Details  Name: Jose Mckenzie MRN: [id number] Date of Birth: 1955-09-20 Referring Provider:  Alwyn Pea, MD  Below are your personal goals for exercise, nutrition, and risk factors. Our goal is to help you stay on track towards obtaining and maintaining these goals. We will be discussing your progress on these goals with you throughout the program.  Initial Exercise Prescription:  Initial Exercise Prescription - 12/20/21 1100       Date of Initial Exercise RX and Referring Provider   Date 12/20/21    Referring Provider Dorothyann Peng MD      Oxygen   Maintain Oxygen Saturation 88% or higher      Treadmill   MPH 3.3    Grade 1    Minutes 15    METs 3.98      REL-XR   Level 3    Speed 50    Minutes 15    METs 3      T5 Nustep   Level 3    SPM 80    Minutes 15    METs 3      Track   Laps 42    Minutes 15    METs 3.28      Prescription Details   Frequency (times per week) 3    Duration Progress to 30 minutes of continuous aerobic without signs/symptoms of physical distress      Intensity   THRR 40-80% of Max Heartrate 95-133    Ratings of Perceived Exertion 11-13    Perceived Dyspnea 0-4      Progression   Progression Continue to progress workloads to maintain intensity without signs/symptoms of physical distress.      Resistance Training   Training Prescription Yes    Weight 5 lb    Reps 10-15             Exercise Goals: Frequency: Be able to perform aerobic exercise two to three times per week in program working toward 2-5 days per week of home exercise.  Intensity: Work with a perceived exertion of 11 (fairly light) - 15 (hard) while following your exercise prescription.  We will make changes to your prescription with you as you progress through the program.   Duration: Be able to do 30 to 45 minutes of continuous aerobic exercise in addition to a 5 minute warm-up and a 5 minute cool-down routine.   Nutrition  Goals: Your personal nutrition goals will be established when you do your nutrition analysis with the dietician.  The following are general nutrition guidelines to follow: Cholesterol < 200mg /day Sodium < 1500mg /day Fiber: Men over 50 yrs - 30 grams per day  Personal Goals:  Personal Goals and Risk Factors at Admission - 12/20/21 1148       Core Components/Risk Factors/Patient Goals on Admission    Weight Management Yes;Weight Loss    Intervention Weight Management: Develop a combined nutrition and exercise program designed to reach desired caloric intake, while maintaining appropriate intake of nutrient and fiber, sodium and fats, and appropriate energy expenditure required for the weight goal.;Weight Management: Provide education and appropriate resources to help participant work on and attain dietary goals.    Admit Weight 185 lb 4.8 oz (84.1 kg)    Goal Weight: Short Term 180 lb (81.6 kg)    Goal Weight: Long Term 174 lb (78.9 kg)    Expected Outcomes Short Term: Continue to assess and modify interventions until short term weight is achieved;Long Term: Adherence  to nutrition and physical activity/exercise program aimed toward attainment of established weight goal;Weight Loss: Understanding of general recommendations for a balanced deficit meal plan, which promotes 1-2 lb weight loss per week and includes a negative energy balance of [phone] kcal/d;Understanding recommendations for meals to include 15-35% energy as protein, 25-35% energy from fat, 35-60% energy from carbohydrates, less than 200mg  of dietary cholesterol, 20-35 gm of total fiber daily;Understanding of distribution of calorie intake throughout the day with the consumption of 4-5 meals/snacks    Hypertension Yes    Intervention Provide education on lifestyle modifcations including regular physical activity/exercise, weight management, moderate sodium restriction and increased consumption of fresh fruit, vegetables, and low fat  dairy, alcohol moderation, and smoking cessation.;Monitor prescription use compliance.    Expected Outcomes Short Term: Continued assessment and intervention until BP is < 140/76mm HG in hypertensive participants. < 130/85mm HG in hypertensive participants with diabetes, heart failure or chronic kidney disease.;Long Term: Maintenance of blood pressure at goal levels.    Lipids Yes    Intervention Provide education and support for participant on nutrition & aerobic/resistive exercise along with prescribed medications to achieve LDL 70mg , HDL >40mg .    Expected Outcomes Short Term: Participant states understanding of desired cholesterol values and is compliant with medications prescribed. Participant is following exercise prescription and nutrition guidelines.;Long Term: Cholesterol controlled with medications as prescribed, with individualized exercise RX and with personalized nutrition plan. Value goals: LDL < 70mg , HDL > 40 mg.             Tobacco Use Initial Evaluation: Social History   Tobacco Use  Smoking Status Never  Smokeless Tobacco Never    Exercise Goals and Review:  Exercise Goals     Row Name 12/20/21 1143             Exercise Goals   Increase Physical Activity Yes       Intervention Provide advice, education, support and counseling about physical activity/exercise needs.;Develop an individualized exercise prescription for aerobic and resistive training based on initial evaluation findings, risk stratification, comorbidities and participant's personal goals.       Expected Outcomes Short Term: Attend rehab on a regular basis to increase amount of physical activity.;Long Term: Add in home exercise to make exercise part of routine and to increase amount of physical activity.;Long Term: Exercising regularly at least 3-5 days a week.       Increase Strength and Stamina Yes       Intervention Provide advice, education, support and counseling about physical activity/exercise  needs.;Develop an individualized exercise prescription for aerobic and resistive training based on initial evaluation findings, risk stratification, comorbidities and participant's personal goals.       Expected Outcomes Short Term: Increase workloads from initial exercise prescription for resistance, speed, and METs.;Short Term: Perform resistance training exercises routinely during rehab and add in resistance training at home;Long Term: Improve cardiorespiratory fitness, muscular endurance and strength as measured by increased METs and functional capacity (6MWT)       Able to understand and use rate of perceived exertion (RPE) scale Yes       Intervention Provide education and explanation on how to use RPE scale       Expected Outcomes Short Term: Able to use RPE daily in rehab to express subjective intensity level;Long Term:  Able to use RPE to guide intensity level when exercising independently       Able to understand and use Dyspnea scale Yes  Intervention Provide education and explanation on how to use Dyspnea scale       Expected Outcomes Short Term: Able to use Dyspnea scale daily in rehab to express subjective sense of shortness of breath during exertion;Long Term: Able to use Dyspnea scale to guide intensity level when exercising independently       Knowledge and understanding of Target Heart Rate Range (THRR) Yes       Intervention Provide education and explanation of THRR including how the numbers were predicted and where they are located for reference       Expected Outcomes Short Term: Able to state/look up THRR;Short Term: Able to use daily as guideline for intensity in rehab;Long Term: Able to use THRR to govern intensity when exercising independently       Able to check pulse independently Yes       Intervention Provide education and demonstration on how to check pulse in carotid and radial arteries.;Review the importance of being able to check your own pulse for safety during  independent exercise       Expected Outcomes Long Term: Able to check pulse independently and accurately;Short Term: Able to explain why pulse checking is important during independent exercise       Understanding of Exercise Prescription Yes       Intervention Provide education, explanation, and written materials on patient's individual exercise prescription       Expected Outcomes Long Term: Able to explain home exercise prescription to exercise independently;Short Term: Able to explain program exercise prescription                Copy of goals given to participant.

## 2021-12-24 ENCOUNTER — Encounter: Payer: BC Managed Care – PPO | Admitting: *Deleted

## 2021-12-24 DIAGNOSIS — Z955 Presence of coronary angioplasty implant and graft: Secondary | ICD-10-CM

## 2021-12-24 NOTE — Progress Notes (Signed)
Daily Session Note  Patient Details  Name: Jose Mckenzie MRN: [id number] Date of Birth: June 21, 1955 Referring Provider:   Flowsheet Row Cardiac Rehab from 12/20/2021 in [facility] Cardiac and Pulmonary Rehab  Referring Provider [name] MD       Encounter Date: 12/24/2021  Check In:  Session Check In - 12/24/21 0732       Check-In   Supervising physician immediately available to respond to emergencies See telemetry face sheet for immediately available ER MD    Location [facility]-Cardiac & Pulmonary Rehab    Staff Present Justin Mend, Jaci Carrel, BS, ACSM CEP, Exercise Physiologist;Susanne Bice, RN, BSN, CCRP;Other   Darlyne Russian RN, [place]   Virtual Visit No    Medication changes reported     No    Fall or balance concerns reported    No    Warm-up and Cool-down Performed on first and last piece of equipment    Resistance Training Performed Yes    VAD Patient? No    PAD/SET Patient? No      Pain Assessment   Currently in Pain? No/denies                Social History   Tobacco Use  Smoking Status Never  Smokeless Tobacco Never    Goals Met:  Exercise tolerated well Personal goals reviewed No report of concerns or symptoms today Strength training completed today  Goals Unmet:  Not Applicable  Comments: First full day of exercise!  Patient was oriented to gym and equipment including functions, settings, policies, and procedures.  Patient's individual exercise prescription and treatment plan were reviewed.  All starting workloads were established based on the results of the 6 minute walk test done at initial orientation visit.  The plan for exercise progression was also introduced and progression will be customized based on patient's performance and goals.    Dr. Emily Filbert is Medical Director for [place].  Dr. Ottie Glazier is Medical Director for [facility] Pul[facility] Rehabilitation.

## 2021-12-26 ENCOUNTER — Encounter: Payer: BC Managed Care – PPO | Admitting: *Deleted

## 2021-12-26 DIAGNOSIS — Z955 Presence of coronary angioplasty implant and graft: Secondary | ICD-10-CM | POA: Diagnosis not present

## 2021-12-26 NOTE — Progress Notes (Signed)
Daily Session Note  Patient Details  Name: Jose Mckenzie MRN: [id number] Date of Birth: July 06, 1955 Referring Provider:   Flowsheet Row Cardiac Rehab from 12/20/2021 in [facility] Cardiac and Pulmonary Rehab  Referring Provider [name] MD       Encounter Date: 12/26/2021  Check In:  Session Check In - 12/26/21 0749       Check-In   Supervising physician immediately available to respond to emergencies See telemetry face sheet for immediately available ER MD    Location [facility]-Cardiac & Pulmonary Rehab    Staff Present Justin Mend, RCP,RRT,BSRT;Noah Tickle, BS, Exercise Physiologist;Other   Darlyne Russian, RN   Virtual Visit No    Medication changes reported     No    Fall or balance concerns reported    No    Warm-up and Cool-down Performed on first and last piece of equipment    Resistance Training Performed Yes    VAD Patient? No    PAD/SET Patient? No      Pain Assessment   Currently in Pain? No/denies                Social History   Tobacco Use  Smoking Status Never  Smokeless Tobacco Never    Goals Met:  Independence with exercise equipment Exercise tolerated well No report of concerns or symptoms today Strength training completed today  Goals Unmet:  Not Applicable  Comments: Pt able to follow exercise prescription today without complaint.  Will continue to monitor for progression.    Dr. Emily Filbert is Medical Director for [place].  Dr. Ottie Glazier is Medical Director for [facility] Pulmonary Rehabilitation.

## 2021-12-31 ENCOUNTER — Encounter: Payer: BC Managed Care – PPO | Attending: Internal Medicine | Admitting: *Deleted

## 2021-12-31 DIAGNOSIS — Z48812 Encounter for surgical aftercare following surgery on the circulatory system: Secondary | ICD-10-CM | POA: Insufficient documentation

## 2021-12-31 DIAGNOSIS — Z955 Presence of coronary angioplasty implant and graft: Secondary | ICD-10-CM | POA: Insufficient documentation

## 2021-12-31 NOTE — Progress Notes (Signed)
Daily Session Note  Patient Details  Name: Jose Mckenzie MRN: [id number] Date of Birth: [birth year] Referring Provider:   Flowsheet Row Cardiac Rehab from 12/20/2021 in [facility] Cardiac and Pulmonary Rehab  Referring Provider [name] MD       Encounter Date: 12/31/2021  Check In:  Session Check In - 12/31/21 0827       Check-In   Supervising physician immediately available to respond to emergencies See telemetry face sheet for immediately available ER MD    Location [facility]-Cardiac & Pulmonary Rehab    Staff Present Earlean Shawl, BS, ACSM CEP, Exercise Physiologist;Joseph Rosebud Poles, RN, [place]    Virtual Visit No    Medication changes reported     No    Fall or balance concerns reported    No    Warm-up and Cool-down Performed on first and last piece of equipment    Resistance Training Performed Yes    VAD Patient? No    PAD/SET Patient? No      Pain Assessment   Currently in Pain? No/denies                Social History   Tobacco Use  Smoking Status Never  Smokeless Tobacco Never    Goals Met:  Independence with exercise equipment Exercise tolerated well No report of concerns or symptoms today Strength training completed today  Goals Unmet:  Not Applicable  Comments: Pt able to follow exercise prescription today without complaint.  Will continue to monitor for progression.    Dr. Emily Filbert is Medical Director for [place].  Dr. Ottie Glazier is Medical Director for [facility] Pulmonary Rehabilitation.

## 2022-01-02 ENCOUNTER — Encounter: Payer: Self-pay | Admitting: *Deleted

## 2022-01-02 ENCOUNTER — Encounter: Payer: BC Managed Care – PPO | Admitting: *Deleted

## 2022-01-02 DIAGNOSIS — Z955 Presence of coronary angioplasty implant and graft: Secondary | ICD-10-CM

## 2022-01-02 NOTE — Progress Notes (Signed)
Daily Session Note  Patient Details  Name: Jose Mckenzie MRN: [id number] Date of Birth: Oct 03, 1955 Referring Provider:   Flowsheet Row Cardiac Rehab from 12/20/2021 in [facility] Cardiac and Pulmonary Rehab  Referring Provider [name] MD       Encounter Date: 01/02/2022  Check In:  Session Check In - 01/02/22 0804       Check-In   Supervising physician immediately available to respond to emergencies See telemetry face sheet for immediately available ER MD    Location [facility]-Cardiac & Pulmonary Rehab    Staff Present Antionette Fairy, BS, Exercise Physiologist;Joseph Rosebud Poles, RN, [place]    Virtual Visit No    Medication changes reported     No    Fall or balance concerns reported    No    Warm-up and Cool-down Performed on first and last piece of equipment    Resistance Training Performed Yes    VAD Patient? No    PAD/SET Patient? No      Pain Assessment   Currently in Pain? No/denies                Social History   Tobacco Use  Smoking Status Never  Smokeless Tobacco Never    Goals Met:  Independence with exercise equipment Exercise tolerated well No report of concerns or symptoms today Strength training completed today  Goals Unmet:  Not Applicable  Comments: Pt able to follow exercise prescription today without complaint.  Will continue to monitor for progression.    Dr. Emily Filbert is Medical Director for [place].  Dr. Ottie Glazier is Medical Director for [facility] Pulmonary Rehabilitation.

## 2022-01-02 NOTE — Progress Notes (Signed)
Cardiac Individual Treatment Plan  Patient Details  Name: Jose Mckenzie MRN: [id number] Date of Birth: 11-22-1955 Referring Provider:   Flowsheet Row Cardiac Rehab from 12/20/2021 in [facility] Cardiac and Pulmonary Rehab  Referring Provider [name] MD       Initial Encounter Date:  Flowsheet Row Cardiac Rehab from 12/20/2021 in [facility] Cardiac and Pulmonary Rehab  Date 12/20/21       Visit Diagnosis: Status post coronary artery stent placement  Patient's Home Medications on Admission:  Current Outpatient Medications:    aspirin EC 81 MG tablet, Take 1 tablet (81 mg total) by mouth daily. Swallow whole., Disp: 90 tablet, Rfl: 3   losartan (COZAAR) 50 MG tablet, Take 1 tablet (50 mg total) by mouth daily., Disp: 30 tablet, Rfl: 11   metoprolol succinate (TOPROL XL) 25 MG 24 hr tablet, Take 1 tablet (25 mg total) by mouth daily., Disp: 30 tablet, Rfl: 11   rosuvastatin (CRESTOR) 20 MG tablet, Take 2 tablets (40 mg total) by mouth daily., Disp: 30 tablet, Rfl: 11   spironolactone (ALDACTONE) 25 MG tablet, Take 12.5 mg by mouth daily., Disp: , Rfl:    ticagrelor (BRILINTA) 90 MG TABS tablet, Take 1 tablet (90 mg total) by mouth 2 (two) times daily., Disp: 60 tablet, Rfl: 0  Past Medical History: Past Medical History:  Diagnosis Date   Hx of dysplastic nevus 2015   multiple sites    Tobacco Use: Social History   Tobacco Use  Smoking Status Never  Smokeless Tobacco Never    Labs: Review Flowsheet        No data to display           Exercise Target Goals: Exercise Program Goal: Individual exercise prescription set using results from initial 6 min walk test and THRR while considering  patient's activity barriers and safety.   Exercise Prescription Goal: Initial exercise prescription builds to 30-45 minutes a day of aerobic activity, 2-3 days per week.  Home exercise guidelines will be given to patient during program as part of exercise prescription that the  participant will acknowledge.   Education: Aerobic Exercise: - Group verbal and visual presentation on the components of exercise prescription. Introduces F.I.T.T principle from ACSM for exercise prescriptions.  Reviews F.I.T.T. principles of aerobic exercise including progression. Written material given at graduation.   Education: Resistance Exercise: - Group verbal and visual presentation on the components of exercise prescription. Introduces F.I.T.T principle from ACSM for exercise prescriptions  Reviews F.I.T.T. principles of resistance exercise including progression. Written material given at graduation.    Education: Exercise & Equipment Safety: - Individual verbal instruction and demonstration of equipment use and safety with use of the equipment. Flowsheet Row Cardiac Rehab from 12/26/2021 in [facility] Cardiac and Pulmonary Rehab  Date 12/20/21  Educator [facility]  Instruction Review Code 1- Verbalizes Understanding       Education: Exercise Physiology & General Exercise Guidelines: - Group verbal and written instruction with models to review the exercise physiology of the cardiovascular system and associated critical values. Provides general exercise guidelines with specific guidelines to those with heart or lung disease.    Education: Flexibility, Balance, Mind/Body Relaxation: - Group verbal and visual presentation with interactive activity on the components of exercise prescription. Introduces F.I.T.T principle from ACSM for exercise prescriptions. Reviews F.I.T.T. principles of flexibility and balance exercise training including progression. Also discusses the mind body connection.  Reviews various relaxation techniques to help reduce and manage stress (i.e. Deep breathing, progressive muscle relaxation, and  visualization). Balance handout provided to take home. Written material given at graduation.   Activity Barriers & Risk Stratification:  Activity Barriers & Cardiac Risk  Stratification - 12/20/21 1138       Activity Barriers & Cardiac Risk Stratification   Activity Barriers Back Problems;Deconditioning    Cardiac Risk Stratification Moderate             6 Minute Walk:  6 Minute Walk     Row Name 12/20/21 1137         6 Minute Walk   Phase Initial     Distance 1675 feet     Walk Time 6 minutes     # of Rest Breaks 0     MPH 3.17     METS 3.69     RPE 7     VO2 Peak 12.87     Symptoms No     Resting HR 57 bpm     Resting BP 128/64     Resting Oxygen Saturation  98 %     Exercise Oxygen Saturation  during 6 min walk 97 %     Max Ex. HR 93 bpm     Max Ex. BP 128/74     2 Minute Post BP 112/62              Oxygen Initial Assessment:   Oxygen Re-Evaluation:   Oxygen Discharge (Final Oxygen Re-Evaluation):   Initial Exercise Prescription:  Initial Exercise Prescription - 12/20/21 1100       Date of Initial Exercise RX and Referring Provider   Date 12/20/21    Referring Provider [name] MD      Oxygen   Maintain Oxygen Saturation 88% or higher      Treadmill   MPH 3.3    Grade 1    Minutes 15    METs 3.98      REL-XR   Level 3    Speed 50    Minutes 15    METs 3      T5 Nustep   Level 3    SPM 80    Minutes 15    METs 3      Track   Laps 42    Minutes 15    METs 3.28      Prescription Details   Frequency (times per week) 3    Duration Progress to 30 minutes of continuous aerobic without signs/symptoms of physical distress      Intensity   THRR 40-80% of Max Heartrate 95-133    Ratings of Perceived Exertion 11-13    Perceived Dyspnea 0-4      Progression   Progression Continue to progress workloads to maintain intensity without signs/symptoms of physical distress.      Resistance Training   Training Prescription Yes    Weight 5 lb    Reps 10-15             Perform Capillary Blood Glucose checks as needed.  Exercise Prescription Changes:   Exercise Prescription Changes      Row Name 12/20/21 1100 01/01/22 1400           Response to Exercise   Blood Pressure (Admit) 128/64 122/62      Blood Pressure (Exercise) 128/74 142/80      Blood Pressure (Exit) 112/64 106/62      Heart Rate (Admit) 57 bpm 55 bpm      Heart Rate (Exercise) 93 bpm 111 bpm  Heart Rate (Exit) 68 bpm 76 bpm      Oxygen Saturation (Admit) 98 % --      Oxygen Saturation (Exercise) 97 % --      Rating of Perceived Exertion (Exercise) 7 13      Symptoms none none      Comments walk test results First three days of exercise      Duration -- Continue with 30 min of aerobic exercise without signs/symptoms of physical distress.      Intensity -- THRR unchanged        Progression   Progression -- Continue to progress workloads to maintain intensity without signs/symptoms of physical distress.      Average METs -- 3.66        Resistance Training   Training Prescription -- Yes      Weight -- 5 lb      Reps -- 10-15        Interval Training   Interval Training -- No        Treadmill   MPH -- 3.6      Grade -- 3      Minutes -- 15      METs -- 5.25        REL-XR   Level -- 3      Minutes -- 15      METs -- 4.2        T5 Nustep   Level -- 3      Minutes -- 15      METs -- 2.6        Track   Laps -- 36      Minutes -- 15      METs -- 2.96        Oxygen   Maintain Oxygen Saturation -- 88% or higher               Exercise Comments:   Exercise Comments     Row Name 12/24/21 0733           Exercise Comments First full day of exercise!  Patient was oriented to gym and equipment including functions, settings, policies, and procedures.  Patient's individual exercise prescription and treatment plan were reviewed.  All starting workloads were established based on the results of the 6 minute walk test done at initial orientation visit.  The plan for exercise progression was also introduced and progression will be customized based on patient's performance and goals.                 Exercise Goals and Review:   Exercise Goals     Row Name 12/20/21 1143             Exercise Goals   Increase Physical Activity Yes       Intervention Provide advice, education, support and counseling about physical activity/exercise needs.;Develop an individualized exercise prescription for aerobic and resistive training based on initial evaluation findings, risk stratification, comorbidities and participant's personal goals.       Expected Outcomes Short Term: Attend rehab on a regular basis to increase amount of physical activity.;Long Term: Add in home exercise to make exercise part of routine and to increase amount of physical activity.;Long Term: Exercising regularly at least 3-5 days a week.       Increase Strength and Stamina Yes       Intervention Provide advice, education, support and counseling about physical activity/exercise needs.;Develop an individualized exercise prescription for aerobic and resistive training based  on initial evaluation findings, risk stratification, comorbidities and participant's personal goals.       Expected Outcomes Short Term: Increase workloads from initial exercise prescription for resistance, speed, and METs.;Short Term: Perform resistance training exercises routinely during rehab and add in resistance training at home;Long Term: Improve cardiorespiratory fitness, muscular endurance and strength as measured by increased METs and functional capacity (6MWT)       Able to understand and use rate of perceived exertion (RPE) scale Yes       Intervention Provide education and explanation on how to use RPE scale       Expected Outcomes Short Term: Able to use RPE daily in rehab to express subjective intensity level;Long Term:  Able to use RPE to guide intensity level when exercising independently       Able to understand and use Dyspnea scale Yes       Intervention Provide education and explanation on how to use Dyspnea scale        Expected Outcomes Short Term: Able to use Dyspnea scale daily in rehab to express subjective sense of shortness of breath during exertion;Long Term: Able to use Dyspnea scale to guide intensity level when exercising independently       Knowledge and understanding of Target Heart Rate Range (THRR) Yes       Intervention Provide education and explanation of THRR including how the numbers were predicted and where they are located for reference       Expected Outcomes Short Term: Able to state/look up THRR;Short Term: Able to use daily as guideline for intensity in rehab;Long Term: Able to use THRR to govern intensity when exercising independently       Able to check pulse independently Yes       Intervention Provide education and demonstration on how to check pulse in carotid and radial arteries.;Review the importance of being able to check your own pulse for safety during independent exercise       Expected Outcomes Long Term: Able to check pulse independently and accurately;Short Term: Able to explain why pulse checking is important during independent exercise       Understanding of Exercise Prescription Yes       Intervention Provide education, explanation, and written materials on patient's individual exercise prescription       Expected Outcomes Long Term: Able to explain home exercise prescription to exercise independently;Short Term: Able to explain program exercise prescription                Exercise Goals Re-Evaluation :  Exercise Goals Re-Evaluation     Row Name 12/24/21 0733 01/01/22 1457           Exercise Goal Re-Evaluation   Exercise Goals Review Increase Physical Activity;Able to understand and use rate of perceived exertion (RPE) scale;Increase Strength and Stamina;Able to understand and use Dyspnea scale;Knowledge and understanding of Target Heart Rate Range (THRR);Understanding of Exercise Prescription Increase Physical Activity;Increase Strength and Stamina;Understanding of  Exercise Prescription      Comments Reviewed RPE and dyspnea scales, THR and program prescription with pt today.  Pt voiced understanding and was given a copy of goals to take home. Therman is off to a good start in rehab. He had an overall average MET level of 3.66 METs. He also increased his workload on the treadmill to a speed of 3.6 mph and an incline of 3.0%. He has tolerated using 5 lb hand weights as well. We will continue to monitor his progress in  the program.      Expected Outcomes Short: Use RPE daily to regulate intensity. Long: Follow program prescription in THR. Short: Continue to increase workloads. Long: Continue to increase strength and stamina.               Discharge Exercise Prescription (Final Exercise Prescription Changes):  Exercise Prescription Changes - 01/01/22 1400       Response to Exercise   Blood Pressure (Admit) 122/62    Blood Pressure (Exercise) 142/80    Blood Pressure (Exit) 106/62    Heart Rate (Admit) 55 bpm    Heart Rate (Exercise) 111 bpm    Heart Rate (Exit) 76 bpm    Rating of Perceived Exertion (Exercise) 13    Symptoms none    Comments First three days of exercise    Duration Continue with 30 min of aerobic exercise without signs/symptoms of physical distress.    Intensity THRR unchanged      Progression   Progression Continue to progress workloads to maintain intensity without signs/symptoms of physical distress.    Average METs 3.66      Resistance Training   Training Prescription Yes    Weight 5 lb    Reps 10-15      Interval Training   Interval Training No      Treadmill   MPH 3.6    Grade 3    Minutes 15    METs 5.25      REL-XR   Level 3    Minutes 15    METs 4.2      T5 Nustep   Level 3    Minutes 15    METs 2.6      Track   Laps 36    Minutes 15    METs 2.96      Oxygen   Maintain Oxygen Saturation 88% or higher             Nutrition:  Target Goals: Understanding of nutrition guidelines, daily intake  of sodium <1566m, cholesterol <2062m calories 30% from fat and 7% or less from saturated fats, daily to have 5 or more servings of fruits and vegetables.  Education: All About Nutrition: -Group instruction provided by verbal, written material, interactive activities, discussions, models, and posters to present general guidelines for heart healthy nutrition including fat, fiber, MyPlate, the role of sodium in heart healthy nutrition, utilization of the nutrition label, and utilization of this knowledge for meal planning. Follow up email sent as well. Written material given at graduation. [facility]  Education need identified 12/20/21  Date 12/26/21  Educator MCNorth RobinsonInstruction Review Code 1- Verbalizes Understanding       Biometrics:  Pre Biometrics - 12/20/21 1143       Pre Biometrics   Height '5\' 10"'  (1.778 m)    Weight 185 lb 4.8 oz (84.1 kg)    Waist Circumference 35.5 inches    Hip Circumference 39 inches    Waist to Hip Ratio 0.91 %    BMI (Calculated) 26.59    Single Leg Stand 30 seconds              Nutrition Therapy Plan and Nutrition Goals:  Nutrition Therapy & Goals - 12/26/21 1428       Nutrition Therapy   RD appointment deferred Yes   Pt feels he is doing well with his nutrition and is not interested in meeting with RD at  this time. He came to nutrition education 12/26/21. Will continue to follow up.     Personal Nutrition Goals   Nutrition Goal Pt feels he is doing well with his nutrition and is not interested in meeting with RD at this time. He came to nutrition education 12/26/21. Will continue to follow up.             Nutrition Assessments:  MEDIFICTS Score Key: ?70 Need to make dietary changes  40-70 Heart Healthy Diet ? 40 Therapeutic Level Cholesterol Diet  Flowsheet Row Cardiac Rehab from 12/20/2021 in [facility] Cardiac and Pulmonary Rehab  Picture Your Plate Total Score on  Admission 85      Picture Your Plate Scores: <89 Unhealthy dietary pattern with much room for improvement. 41-50 Dietary pattern unlikely to meet recommendations for good health and room for improvement. 51-60 More healthful dietary pattern, with some room for improvement.  >60 Healthy dietary pattern, although there may be some specific behaviors that could be improved.    Nutrition Goals Re-Evaluation:   Nutrition Goals Discharge (Final Nutrition Goals Re-Evaluation):   Psychosocial: Target Goals: Acknowledge presence or absence of significant depression and/or stress, maximize coping skills, provide positive support system. Participant is able to verbalize types and ability to use techniques and skills needed for reducing stress and depression.   Education: Stress, Anxiety, and Depression - Group verbal and visual presentation to define topics covered.  Reviews how body is impacted by stress, anxiety, and depression.  Also discusses healthy ways to reduce stress and to treat/manage anxiety and depression.  Written material given at graduation.   Education: Sleep Hygiene -Provides group verbal and written instruction about how sleep can affect your health.  Define sleep hygiene, discuss sleep cycles and impact of sleep habits. Review good sleep hygiene tips.    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 Yes   wife, friends     Barriers   Psychosocial barriers to participate in program There are no identifiable barriers or psychosocial needs.      Screening Interventions   Interventions Encouraged to exercise;To provide support and resources with identified psychosocial needs;Provide feedback  about the scores to participant    Expected Outcomes Short Term goal: Utilizing psychosocial counselor, staff and physician to assist with identification of specific Stressors or current issues interfering with healing process. Setting desired goal for each stressor or current issue identified.;Long Term Goal: Stressors or current issues are controlled or eliminated.;Short Term goal: Identification and review with participant of any Quality of Life or Depression concerns found by scoring the questionnaire.;Long Term goal: The participant improves quality of Life and PHQ9 Scores as seen by post scores and/or verbalization of changes             Quality of Life Scores:   Quality of Life - 12/20/21 1144       Quality of Life   Select Quality of Life      Quality of Life Scores   Health/Function Pre 29.83 %    Socioeconomic Pre 30 %    Psych/Spiritual Pre 30 %    Family Pre 30 %    GLOBAL  Pre 29.93 %            Scores of 19 and below usually indicate a poorer quality of life in these areas.  A difference of  2-3 points is a clinically meaningful difference.  A difference of 2-3 points in the total score of the Quality of Life Index has been associated with significant improvement in overall quality of life, self-image, physical symptoms, and general health in studies assessing change in quality of life.  PHQ-9: Review Flowsheet       12/20/2021  Depression screen PHQ 2/9  Decreased Interest 0  Down, Depressed, Hopeless 0  PHQ - 2 Score 0  Altered sleeping 0  Tired, decreased energy 0  Change in appetite 0  Feeling bad or failure about yourself  0  Trouble concentrating 0  Moving slowly or fidgety/restless 0  Suicidal thoughts 0  PHQ-9 Score 0  Difficult doing work/chores Not difficult at all   Interpretation of Total Score  Total Score Depression Severity:  1-4 = Minimal depression, 5-9 = Mild depression, 10-14 = Moderate depression, 15-19 = Moderately severe depression,  20-27 = Severe depression   Psychosocial Evaluation and Intervention:  Psychosocial Evaluation - 12/14/21 1320       Psychosocial Evaluation & Interventions   Interventions Encouraged to exercise with the program and follow exercise prescription    Comments Kohl has no barriers to attending the program. 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 This can be long hours and time at home to keep the class running. He plans to retire in Dec this year. He lives with his wife and she and some friends are his support. He wants to be able to get back to doing all his previous activities. He stated that his doctor told hm he will reach that goal.    Expected Outcomes STG: Soham will be able to attend all scheduled sessions, he will progress with his exercise. LTG Geral will continue his exercise progression after discharge    Continue Psychosocial Services  Follow up required by staff             Psychosocial Re-Evaluation:   Psychosocial Discharge (Final Psychosocial Re-Evaluation):   Vocational Rehabilitation: Provide vocational rehab assistance to qualifying candidates.   Vocational Rehab Evaluation & Intervention:  Vocational Rehab - 12/20/21 1145       Initial Vocational Rehab Evaluation & Intervention   Assessment shows need for Vocational Rehabilitation No   Already returned to work            Education: Education Goals: Education classes will be provided on a variety of topics geared toward better understanding of heart health and risk factor modification. Participant will state understanding/return demonstration of topics presented as noted by education test scores.  Learning Barriers/Preferences:   General Cardiac Education Topics:  AED/CPR: - Group verbal and written instruction with the use of models to demonstrate the basic use of the AED with the basic ABC's of resuscitation.   Anatomy and Cardiac Procedures: - Group verbal and visual  presentation and models provide information about basic cardiac anatomy and function. Reviews the testing methods done to diagnose heart disease and the outcomes of the test results. Describes the treatment choices: Medical Management, Angioplasty, or Coronary Bypass Surgery for treating various heart conditions including Myocardial Infarction, Angina, Valve Disease, and Cardiac Arrhythmias.  Written material given at graduation. Flowsheet Row Cardiac Rehab from 12/26/2021 in [facility] Cardiac and Pulmonary Rehab  Education need identified 12/20/21       Medication Safety: - Group verbal and visual instruction to review commonly prescribed medications for heart and lung disease. Reviews the medication, class of the drug, and side effects. Includes the steps to properly store meds and maintain the prescription regimen.  Written material given at graduation.   Intimacy: - Group verbal instruction through game format to discuss how heart and lung disease can affect sexual intimacy. Written material given at graduation..   Know Your Numbers and Heart Failure: - Group verbal and visual instruction to discuss disease risk factors for cardiac and pulmonary disease and treatment options.  Reviews associated critical values for Overweight/Obesity, Hypertension, Cholesterol, and Diabetes.  Discusses basics of heart failure: signs/symptoms and treatments.  Introduces Heart Failure Zone chart for action plan for heart failure.  Written material given at graduation. Flowsheet Row Cardiac Rehab from 12/26/2021 in [facility] Cardiac and Pulmonary Rehab  Education need identified 12/20/21       Infection Prevention: - Provides verbal and written material to individual with discussion of infection control including proper hand washing and proper equipment cleaning during exercise session. Flowsheet Row Cardiac Rehab from 12/26/2021 in [facility] Cardiac and Pulmonary Rehab  Date 12/20/21  Educator [facility]  Instruction Review Code  1- Verbalizes Understanding       Falls Prevention: - Provides verbal and written material to individual with discussion of falls prevention and safety. Flowsheet Row Cardiac Rehab from 12/26/2021 in [facility] Cardiac and Pulmonary Rehab  Date 12/14/21  Educator SB  Instruction Review Code 1- Verbalizes Understanding       Other: -Provides group and verbal instruction on various topics (see comments)   Knowledge Questionnaire Score:  Knowledge Questionnaire Score - 12/20/21 1145       Knowledge Questionnaire Score   Pre Score 22/26             Core Components/Risk Factors/Patient Goals at Admission:  Personal Goals and Risk Factors at Admission - 12/20/21 1148       Core Components/Risk Factors/Patient Goals on Admission    Weight Management Yes;Weight Loss    Intervention Weight Management: Develop a combined nutrition and exercise program designed to reach desired caloric intake, while maintaining appropriate intake of nutrient and fiber, sodium and fats, and appropriate energy expenditure required for the weight goal.;Weight Management: Provide education and appropriate resources to help participant work on and attain dietary goals.    Admit Weight 185 lb 4.8 oz (84.1 kg)    Goal Weight: Short Term 180 lb (81.6 kg)    Goal Weight: Long Term 174 lb (78.9 kg)    Expected Outcomes Short Term: Continue to assess and modify interventions until short term weight is achieved;Long Term: Adherence to nutrition and physical activity/exercise program aimed toward attainment of established weight goal;Weight Loss: Understanding of general recommendations for a balanced deficit meal plan, which promotes 1-2 lb weight loss per week and includes a negative energy balance of [phone] kcal/d;Understanding recommendations for meals to include 15-35% energy as protein, 25-35% energy from fat, 35-60% energy from carbohydrates, less than 256m of dietary cholesterol, 20-35 gm of total fiber  daily;Understanding of distribution of calorie intake throughout the day with the consumption of 4-5 meals/snacks    Hypertension Yes    Intervention Provide education on lifestyle modifcations including regular physical activity/exercise, weight management, moderate sodium restriction and increased consumption of fresh fruit, vegetables, and low fat dairy, alcohol moderation, and smoking cessation.;Monitor prescription use compliance.    Expected  Outcomes Short Term: Continued assessment and intervention until BP is < 140/26m HG in hypertensive participants. < 130/838mHG in hypertensive participants with diabetes, heart failure or chronic kidney disease.;Long Term: Maintenance of blood pressure at goal levels.    Lipids Yes    Intervention Provide education and support for participant on nutrition & aerobic/resistive exercise along with prescribed medications to achieve LDL <7056mHDL >44m41m  Expected Outcomes Short Term: Participant states understanding of desired cholesterol values and is compliant with medications prescribed. Participant is following exercise prescription and nutrition guidelines.;Long Term: Cholesterol controlled with medications as prescribed, with individualized exercise RX and with personalized nutrition plan. Value goals: LDL < 70mg41mL > 40 mg.             Education:Diabetes - Individual verbal and written instruction to review signs/symptoms of diabetes, desired ranges of glucose level fasting, after meals and with exercise. Acknowledge that pre and post exercise glucose checks will be done for 3 sessions at entry of program.   Core Components/Risk Factors/Patient Goals Review:    Core Components/Risk Factors/Patient Goals at Discharge (Final Review):    ITP Comments:  ITP Comments     Row Name 12/14/21 1329 12/20/21 1136 12/24/21 0733 01/02/22 0656     ITP Comments Virtual orientation call completed today. he has an appointment on Date: 12/20/2021  for  EP eval and gym Orientation.  [facility] . Completed 6MWT and gym orientation. Initial ITP created and sent for review to Dr. Mark Emily Filbertical Director. First full day of exercise!  Patient was oriented to gym and equipment including functions, settings, policies, and procedures.  Patient's individual exercise prescription and treatment plan were reviewed.  All starting workloads were established based on the results of the 6 minute walk test done at initial orientation visit.  The plan for exercise progression was also introduced and progression will be customized based on patient's performance and goals. 30 Day review completed. Medical Director ITP review done, changes made as directed, and signed approval by Medical Director.   New to program             Comments:

## 2022-01-04 ENCOUNTER — Encounter: Payer: BC Managed Care – PPO | Admitting: *Deleted

## 2022-01-04 DIAGNOSIS — Z955 Presence of coronary angioplasty implant and graft: Secondary | ICD-10-CM | POA: Diagnosis not present

## 2022-01-04 NOTE — Progress Notes (Signed)
Daily Session Note  Patient Details  Name: Jose Mckenzie MRN: [id number] Date of Birth: [birth year] Referring Provider:   Flowsheet Row Cardiac Rehab from 12/20/2021 in [facility] Cardiac and Pulmonary Rehab  Referring Provider [name] MD       Encounter Date: 01/04/2022  Check In:  Session Check In - 01/04/22 0834       Check-In   Supervising physician immediately available to respond to emergencies See telemetry face sheet for immediately available ER MD    Location [facility]-Cardiac & Pulmonary Rehab    Staff Present [name], RN, BSN, CCRP;Joseph [place], RCP,RRT,BSRT;Jessica [place], [place], [place], CCRP, CCET    Virtual Visit No    Medication changes reported     No    Fall or balance concerns reported    No    Warm-up and Cool-down Performed on first and last piece of equipment    Resistance Training Performed Yes    VAD Patient? No    PAD/SET Patient? No      Pain Assessment   Currently in Pain? No/denies               Exercise Prescription Changes - 01/04/22 0800       Home Exercise Plan   Plans to continue exercise at Home (comment)   walking, running   Frequency Add 2 additional days to program exercise sessions.    Initial Home Exercises Provided 01/04/22             Social History   Tobacco Use  Smoking Status Never  Smokeless Tobacco Never    Goals Met:  Independence with exercise equipment Exercise tolerated well No report of concerns or symptoms today  Goals Unmet:  Not Applicable  Comments: Pt able to follow exercise prescription today without complaint.  Will continue to monitor for progression.    Dr. Emily Filbert is Medical Director for [place].  Dr. Ottie Glazier is Medical Director for [facility] Pulmonary Rehabilitation.

## 2022-01-07 ENCOUNTER — Encounter: Payer: BC Managed Care – PPO | Admitting: *Deleted

## 2022-01-07 DIAGNOSIS — Z955 Presence of coronary angioplasty implant and graft: Secondary | ICD-10-CM

## 2022-01-07 NOTE — Progress Notes (Signed)
Daily Session Note  Patient Details  Name: Jose Mckenzie MRN: [id number] Date of Birth: 03/19/1956 Referring Provider:   Flowsheet Row Cardiac Rehab from 12/20/2021 in [facility] Cardiac and Pulmonary Rehab  Referring Provider Call[place], Dwayne MD       Encounter Date: 01/07/2022  Check In:  Session Check In - 01/07/22 0752       Check-In   Supervising physician immediately available to respond to emergencies See telemetry face sheet for immediately available ER MD    Location [facility]Cardiac & Pulmonary Rehab    Staff Present Kelly Hayes, BS, ACSM CEP, Exercise Physiologist;Joseph Hood, RCP,RRT,BSRT;Megan Smith, RN, ADN    Virtual Visit No    Medication changes reported     No    Fall or balance concerns reported    No    Warm-up and Cool-down Performed on first and last piece of equipment    Resistance Training Performed Yes    VAD Patient? No    PAD/SET Patient? No      Pain Assessment   Currently in Pain? No/denies                Social History   Tobacco Use  Smoking Status Never  Smokeless Tobacco Never    Goals Met:  Independence with exercise equipment Exercise tolerated well No report of concerns or symptoms today Strength training completed today  Goals Unmet:  Not Applicable  Comments: Pt able to follow exercise prescription today without complaint.  Will continue to monitor for progression.    Dr. Mark Miller is Medical Director for [facility].  Dr. Fuad Aleskerov is Medical Director for [facility] Pulmonary Rehabilitation. 

## 2022-01-09 ENCOUNTER — Encounter: Payer: BC Managed Care – PPO | Admitting: *Deleted

## 2022-01-09 DIAGNOSIS — Z955 Presence of coronary angioplasty implant and graft: Secondary | ICD-10-CM | POA: Diagnosis not present

## 2022-01-09 NOTE — Progress Notes (Signed)
Daily Session Note  Patient Details  Name: Jose Mckenzie MRN: [id number] Date of Birth: 1955-06-25 Referring Provider:   Flowsheet Row Cardiac Rehab from 12/20/2021 in [facility] Cardiac and Pulmonary Rehab  Referring Provider [name] MD       Encounter Date: 01/09/2022  Check In:  Session Check In - 01/09/22 0756       Check-In   Supervising physician immediately available to respond to emergencies See telemetry face sheet for immediately available ER MD    Location [facility]Cardiac & Pulmonary Rehab    Staff Present Antionette Fairy, BS, Exercise Physiologist;Joseph Rosebud Poles, RN, [place]    Virtual Visit No    Medication changes reported     No    Fall or balance concerns reported    No    Warm-up and Cool-down Performed on first and last piece of equipment    Resistance Training Performed Yes    VAD Patient? No    PAD/SET Patient? No      Pain Assessment   Currently in Pain? No/denies                Social History   Tobacco Use  Smoking Status Never  Smokeless Tobacco Never    Goals Met:  Independence with exercise equipment Exercise tolerated well No report of concerns or symptoms today Strength training completed today  Goals Unmet:  Not Applicable  Comments: Pt able to follow exercise prescription today without complaint.  Will continue to monitor for progression.    Dr. Emily Filbert is Medical Director for [place].  Dr. Ottie Glazier is Medical Director for [facility] Pulmonary Rehabilitation.

## 2022-01-11 ENCOUNTER — Encounter: Payer: BC Managed Care – PPO | Admitting: *Deleted

## 2022-01-11 DIAGNOSIS — Z955 Presence of coronary angioplasty implant and graft: Secondary | ICD-10-CM

## 2022-01-11 NOTE — Progress Notes (Signed)
Daily Session Note  Patient Details  Name: Jose Mckenzie MRN: [id number] Date of Birth: 1955-12-10 Referring Provider:   Flowsheet Row Cardiac Rehab from 12/20/2021 in [facility] Cardiac and Pulmonary Rehab  Referring Provider [name] MD       Encounter Date: 01/11/2022  Check In:  Session Check In - 01/11/22 0731       Check-In   Supervising physician immediately available to respond to emergencies See telemetry face sheet for immediately available ER MD    Location [facility]Card[facility] Pul[facility] Re[facility]    Staff Present Nyoka Cowden, RN, BSN, Fenton Foy, BS, Exercise Physiologist;Susanne Bice, RN, BSN, CCRP    Virtual Visit No    Medication changes reported     No    Fall or balance concerns reported    No    Tobacco Cessation No Change    Warm-up and Cool-down Performed on first and last piece of equipment    Resistance Training Performed Yes    VAD Patient? No    PAD/SET Patient? No      Pain Assessment   Currently in Pain? No/denies                Social History   Tobacco Use  Smoking Status Never  Smokeless Tobacco Never    Goals Met:  Independence with exercise equipment Exercise tolerated well No report of concerns or symptoms today  Goals Unmet:  Not Applicable  Comments: Pt able to follow exercise prescription today without complaint.  Will continue to monitor for progression.    Dr. Emily Filbert is Medical Director for [place].  Dr. Ottie Glazier is Medical Director for [facility] Pulmonary Rehabilitation.

## 2022-01-14 ENCOUNTER — Encounter: Payer: BC Managed Care – PPO | Admitting: *Deleted

## 2022-01-14 DIAGNOSIS — Z955 Presence of coronary angioplasty implant and graft: Secondary | ICD-10-CM | POA: Diagnosis not present

## 2022-01-14 NOTE — Progress Notes (Signed)
Daily Session Note  Patient Details  Name: Jose Mckenzie MRN: [id number] Date of Birth: January 07, 1956 Referring Provider:   Flowsheet Row Cardiac Rehab from 12/20/2021 in [facility] Cardiac and Pulmonary Rehab  Referring Provider [name] MD       Encounter Date: 01/14/2022  Check In:  Session Check In - 01/14/22 0803       Check-In   Supervising physician immediately available to respond to emergencies See telemetry face sheet for immediately available ER MD    Location [facility]-Cardiac & Pulmonary Rehab    Staff Present [name], RN, BSN, Laveda Norman, BS, ACSM CEP, Exercise Physiologist;Joseph [place], [place]    Virtual Visit No    Medication changes reported     No    Fall or balance concerns reported    No    Warm-up and Cool-down Performed on first and last piece of equipment    Resistance Training Performed Yes    VAD Patient? No    PAD/SET Patient? No      Pain Assessment   Currently in Pain? No/denies                Social History   Tobacco Use  Smoking Status Never  Smokeless Tobacco Never    Goals Met:  Independence with exercise equipment Exercise tolerated well No report of concerns or symptoms today  Goals Unmet:  Not Applicable  Comments: Pt able to follow exercise prescription today without complaint.  Will continue to monitor for progression.    Dr. Emily Filbert is Medical Director for [place].  Dr. Ottie Glazier is Medical Director for [facility] Pulmonary Rehabilitation.

## 2022-01-16 ENCOUNTER — Encounter: Payer: BC Managed Care – PPO | Admitting: *Deleted

## 2022-01-16 DIAGNOSIS — Z955 Presence of coronary angioplasty implant and graft: Secondary | ICD-10-CM | POA: Diagnosis not present

## 2022-01-16 NOTE — Progress Notes (Signed)
Daily Session Note  Patient Details  Name: Jose Mckenzie MRN: [id number] Date of Birth: 09-Jul-1955 Referring Provider:   Flowsheet Row Cardiac Rehab from 12/20/2021 in [facility] Cardiac and Pulmonary Rehab  Referring Provider [name] MD       Encounter Date: 01/16/2022  Check In:  Session Check In - 01/16/22 0748       Check-In   Supervising physician immediately available to respond to emergencies See telemetry face sheet for immediately available ER MD    Location [facility]Card[facility] & Pulmonary Rehab    Staff Present [name], RN, BSN, CCRP;Jessica [place], MA, RCEP, CCRP, CCET;Joseph [place], [place]    Virtual Visit No    Medication changes reported     No    Fall or balance concerns reported    No    Warm-up and Cool-down Performed on first and last piece of equipment    Resistance Training Performed Yes    VAD Patient? No    PAD/SET Patient? No      Pain Assessment   Currently in Pain? No/denies                Social History   Tobacco Use  Smoking Status Never  Smokeless Tobacco Never    Goals Met:  Independence with exercise equipment Exercise tolerated well No report of concerns or symptoms today  Goals Unmet:  Not Applicable  Comments: Pt able to follow exercise prescription today without complaint.  Will continue to monitor for progression.    Dr. Emily Filbert is Medical Director for [place].  Dr. Ottie Glazier is Medical Director for [facility] Pulmonary Rehabilitation.

## 2022-01-18 ENCOUNTER — Encounter: Payer: BC Managed Care – PPO | Admitting: *Deleted

## 2022-01-18 DIAGNOSIS — Z955 Presence of coronary angioplasty implant and graft: Secondary | ICD-10-CM | POA: Diagnosis not present

## 2022-01-18 NOTE — Progress Notes (Signed)
Daily Session Note  Patient Details  Name: Jose Mckenzie MRN: [id number] Date of Birth: [birth year] Referring Provider:   Flowsheet Row Cardiac Rehab from 12/20/2021 in [facility] Cardiac and Pulmonary Rehab  Referring Provider Jose Amel MD       Encounter Date: 01/18/2022  Check In:  Session Check In - 01/18/22 0802       Check-In   Supervising physician immediately available to respond to emergencies See telemetry face sheet for immediately available ER MD    Location [facility]-Cardiac & Pulmonary Rehab    Staff Present Jose Lark, RN, BSN, CCRP;Jose [place], MA, RCEP, CCRP, CCET;Jose Mckenzie, [place]    Virtual Visit No    Medication changes reported     No    Fall or balance concerns reported    No    Warm-up and Cool-down Performed on first and last piece of equipment    Resistance Training Performed Yes    VAD Patient? No    PAD/SET Patient? No      Pain Assessment   Currently in Pain? No/denies                Social History   Tobacco Use  Smoking Status Never  Smokeless Tobacco Never    Goals Met:  Independence with exercise equipment Exercise tolerated well No report of concerns or symptoms today  Goals Unmet:  Not Applicable  Comments: Pt able to follow exercise prescription today without complaint.  Will continue to monitor for progression.    Dr. Emily Mckenzie is Medical Director for [place].  Dr. Ottie Mckenzie is Medical Director for [facility] Pulmonary Rehabilitation.

## 2022-01-21 ENCOUNTER — Encounter: Payer: BC Managed Care – PPO | Admitting: *Deleted

## 2022-01-21 DIAGNOSIS — Z955 Presence of coronary angioplasty implant and graft: Secondary | ICD-10-CM

## 2022-01-21 NOTE — Progress Notes (Signed)
Daily Session Note  Patient Details  Name: Jose Mckenzie MRN: [id number] Date of Birth: 08/24/1955 Referring Provider:   Flowsheet Row Cardiac Rehab from 12/20/2021 in [facility] Cardiac and Pulmonary Rehab  Referring Provider [name] MD       Encounter Date: 01/21/2022  Check In:  Session Check In - 01/21/22 0811       Check-In   Supervising physician immediately available to respond to emergencies See telemetry face sheet for immediately available ER MD    Location [facility]-Cardiac & Pulmonary Rehab    Staff Present Earlean Shawl, BS, ACSM CEP, Exercise Physiologist;Joseph Rosebud Poles, RN, [place]    Virtual Visit No    Medication changes reported     No    Fall or balance concerns reported    No    Warm-up and Cool-down Performed on first and last piece of equipment    Resistance Training Performed Yes    VAD Patient? No    PAD/SET Patient? No      Pain Assessment   Currently in Pain? No/denies                Social History   Tobacco Use  Smoking Status Never  Smokeless Tobacco Never    Goals Met:  Independence with exercise equipment Exercise tolerated well No report of concerns or symptoms today Strength training completed today  Goals Unmet:  Not Applicable  Comments: Pt able to follow exercise prescription today without complaint.  Will continue to monitor for progression.    Dr. Emily Filbert is Medical Director for [place].  Dr. Ottie Glazier is Medical Director for [facility] Pulmonary Rehabilitation.

## 2022-01-23 ENCOUNTER — Encounter: Payer: BC Managed Care – PPO | Admitting: *Deleted

## 2022-01-23 DIAGNOSIS — Z955 Presence of coronary angioplasty implant and graft: Secondary | ICD-10-CM | POA: Diagnosis not present

## 2022-01-23 NOTE — Progress Notes (Signed)
Daily Session Note  Patient Details  Name: Jose Mckenzie MRN: [id number] Date of Birth: 1955/06/04 Referring Provider:   Flowsheet Row Cardiac Rehab from 12/20/2021 in [facility] Cardiac and Pulmonary Rehab  Referring Provider [name] MD       Encounter Date: 01/23/2022  Check In:  Session Check In - 01/23/22 0751       Check-In   Supervising physician immediately available to respond to emergencies See telemetry face sheet for immediately available ER MD    Location [facility]-Cardiac & Pulmonary Rehab    Staff Present Antionette Fairy, BS, Exercise Physiologist;Joseph Rosebud Poles, RN, [place]    Virtual Visit No    Medication changes reported     No    Fall or balance concerns reported    No    Warm-up and Cool-down Performed on first and last piece of equipment    Resistance Training Performed Yes    VAD Patient? No    PAD/SET Patient? No      Pain Assessment   Currently in Pain? No/denies                Social History   Tobacco Use  Smoking Status Never  Smokeless Tobacco Never    Goals Met:  Independence with exercise equipment Exercise tolerated well No report of concerns or symptoms today Strength training completed today  Goals Unmet:  Not Applicable  Comments: Pt able to follow exercise prescription today without complaint.  Will continue to monitor for progression.    Dr. Emily Filbert is Medical Director for [place].  Dr. Ottie Glazier is Medical Director for [facility] Pulmonary Rehabilitation.

## 2022-01-25 ENCOUNTER — Encounter: Payer: BC Managed Care – PPO | Admitting: *Deleted

## 2022-01-25 DIAGNOSIS — Z955 Presence of coronary angioplasty implant and graft: Secondary | ICD-10-CM

## 2022-01-25 NOTE — Progress Notes (Signed)
Daily Session Note  Patient Details  Name: Jose Mckenzie MRN: [id number] Date of Birth: [birth year] Referring Provider:   Flowsheet Row Cardiac Rehab from 12/20/2021 in [facility] Cardiac and Pulmonary Rehab  Referring Provider [name] MD       Encounter Date: 01/25/2022  Check In:  Session Check In - 01/25/22 0807       Check-In   Supervising physician immediately available to respond to emergencies See telemetry face sheet for immediately available ER MD    Location [facility]-Cardiac & Pulmonary Rehab    Staff Present [name], RN, BSN, CCRP;Jessica [place], MA, RCEP, CCRP, CCET;Joseph [place], [place]    Virtual Visit No    Medication changes reported     No    Fall or balance concerns reported    No    Warm-up and Cool-down Performed on first and last piece of equipment    Resistance Training Performed Yes    VAD Patient? No    PAD/SET Patient? No      Pain Assessment   Currently in Pain? No/denies                Social History   Tobacco Use  Smoking Status Never  Smokeless Tobacco Never    Goals Met:  Independence with exercise equipment Exercise tolerated well No report of concerns or symptoms today  Goals Unmet:  Not Applicable  Comments: Pt able to follow exercise prescription today without complaint.  Will continue to monitor for progression.    Dr. Emily Filbert is Medical Director for [place].  Dr. Ottie Glazier is Medical Director for [facility] Pulmonary Rehabilitation.

## 2022-01-28 ENCOUNTER — Encounter: Payer: BC Managed Care – PPO | Admitting: *Deleted

## 2022-01-28 DIAGNOSIS — Z955 Presence of coronary angioplasty implant and graft: Secondary | ICD-10-CM | POA: Diagnosis not present

## 2022-01-28 NOTE — Progress Notes (Signed)
Daily Session Note  Patient Details  Name: Jose Mckenzie MRN: [id number] Date of Birth: [birth year] Referring Provider:   Flowsheet Row Cardiac Rehab from 12/20/2021 in [facility] Cardiac and Pulmonary Rehab  Referring Provider [name] MD       Encounter Date: 01/28/2022  Check In:  Session Check In - 01/28/22 0824       Check-In   Supervising physician immediately available to respond to emergencies See telemetry face sheet for immediately available ER MD    Location [facility]-Cardiac & Pulmonary Rehab    Staff Present Justin Mend, Ernestina Patches, RN, Doyce Para, BS, ACSM CEP, Exercise Physiologist;Jessica Luan [place], MA, RCEP, CCRP, CCET    Virtual Visit No    Medication changes reported     No    Fall or balance concerns reported    No    Warm-up and Cool-down Performed on first and last piece of equipment    Resistance Training Performed Yes    VAD Patient? No    PAD/SET Patient? No      Pain Assessment   Currently in Pain? No/denies                Social History   Tobacco Use  Smoking Status Never  Smokeless Tobacco Never    Goals Met:  Independence with exercise equipment Exercise tolerated well No report of concerns or symptoms today Strength training completed today  Goals Unmet:  Not Applicable  Comments: Pt able to follow exercise prescription today without complaint.  Will continue to monitor for progression.    Dr. Emily Filbert is Medical Director for [place].  Dr. Ottie Glazier is Medical Director for [facility] Pulmonary Rehabilitation.

## 2022-01-30 ENCOUNTER — Encounter: Payer: BC Managed Care – PPO | Attending: Internal Medicine | Admitting: *Deleted

## 2022-01-30 ENCOUNTER — Encounter: Payer: Self-pay | Admitting: *Deleted

## 2022-01-30 DIAGNOSIS — Z48812 Encounter for surgical aftercare following surgery on the circulatory system: Secondary | ICD-10-CM | POA: Insufficient documentation

## 2022-01-30 DIAGNOSIS — Z955 Presence of coronary angioplasty implant and graft: Secondary | ICD-10-CM | POA: Diagnosis not present

## 2022-01-30 NOTE — Progress Notes (Signed)
Daily Session Note  Patient Details  Name: Jose Mckenzie MRN: [id number] Date of Birth: September 26, 1955 Referring Provider:   Flowsheet Row Cardiac Rehab from 12/20/2021 in [facility] Cardiac and Pulmonary Rehab  Referring Provider [name] MD       Encounter Date: 01/30/2022  Check In:  Session Check In - 01/30/22 0834       Check-In   Supervising physician immediately available to respond to emergencies See telemetry face sheet for immediately available ER MD    Location [facility]-Cardiac & Pulmonary Rehab    Staff Present Antionette Fairy, BS, Exercise Physiologist;Joseph Rosebud Poles, RN, [place]    Virtual Visit No    Medication changes reported     No    Fall or balance concerns reported    No    Warm-up and Cool-down Performed on first and last piece of equipment    Resistance Training Performed Yes    VAD Patient? No    PAD/SET Patient? No      Pain Assessment   Currently in Pain? No/denies                Social History   Tobacco Use  Smoking Status Never  Smokeless Tobacco Never    Goals Met:  Independence with exercise equipment Exercise tolerated well No report of concerns or symptoms today Strength training completed today  Goals Unmet:  Not Applicable  Comments: Pt able to follow exercise prescription today without complaint.  Will continue to monitor for progression.    Dr. Emily Filbert is Medical Director for [place].  Dr. Ottie Glazier is Medical Director for [facility] Pulmonary Rehabilitation.

## 2022-01-30 NOTE — Progress Notes (Signed)
Cardiac Individual Treatment Plan  Patient Details  Name: Jose Mckenzie MRN: [id number] Date of Birth: 14-Apr-1955 Referring Provider:   Flowsheet Row Cardiac Rehab from 12/20/2021 in [facility] Cardiac and Pulmonary Rehab  Referring Provider [name] MD       Initial Encounter Date:  Flowsheet Row Cardiac Rehab from 12/20/2021 in [facility] Cardiac and Pulmonary Rehab  Date 12/20/21       Visit Diagnosis: Status post coronary artery stent placement  Patient's Home Medications on Admission:  Current Outpatient Medications:    aspirin EC 81 MG tablet, Take 1 tablet (81 mg total) by mouth daily. Swallow whole., Disp: 90 tablet, Rfl: 3   losartan (COZAAR) 50 MG tablet, Take 1 tablet (50 mg total) by mouth daily., Disp: 30 tablet, Rfl: 11   metoprolol succinate (TOPROL XL) 25 MG 24 hr tablet, Take 1 tablet (25 mg total) by mouth daily., Disp: 30 tablet, Rfl: 11   rosuvastatin (CRESTOR) 20 MG tablet, Take 2 tablets (40 mg total) by mouth daily., Disp: 30 tablet, Rfl: 11   spironolactone (ALDACTONE) 25 MG tablet, Take 12.5 mg by mouth daily., Disp: , Rfl:    ticagrelor (BRILINTA) 90 MG TABS tablet, Take 1 tablet (90 mg total) by mouth 2 (two) times daily., Disp: 60 tablet, Rfl: 0  Past Medical History: Past Medical History:  Diagnosis Date   Hx of dysplastic nevus 2015   multiple sites    Tobacco Use: Social History   Tobacco Use  Smoking Status Never  Smokeless Tobacco Never    Labs: Review Flowsheet        No data to display           Exercise Target Goals: Exercise Program Goal: Individual exercise prescription set using results from initial 6 min walk test and THRR while considering  patient's activity barriers and safety.   Exercise Prescription Goal: Initial exercise prescription builds to 30-45 minutes a day of aerobic activity, 2-3 days per week.  Home exercise guidelines will be given to patient during program as part of exercise prescription that the  participant will acknowledge.   Education: Aerobic Exercise: - Group verbal and visual presentation on the components of exercise prescription. Introduces F.I.T.T principle from ACSM for exercise prescriptions.  Reviews F.I.T.T. principles of aerobic exercise including progression. Written material given at graduation.   Education: Resistance Exercise: - Group verbal and visual presentation on the components of exercise prescription. Introduces F.I.T.T principle from ACSM for exercise prescriptions  Reviews F.I.T.T. principles of resistance exercise including progression. Written material given at graduation.    Education: Exercise & Equipment Safety: - Individual verbal instruction and demonstration of equipment use and safety with use of the equipment. Flowsheet Row Cardiac Rehab from 01/30/2022 in [facility] Cardiac and Pulmonary Rehab  Date 12/20/21  Educator [facility]  Instruction Review Code 1- Verbalizes Understanding       Education: Exercise Physiology & General Exercise Guidelines: - Group verbal and written instruction with models to review the exercise physiology of the cardiovascular system and associated critical values. Provides general exercise guidelines with specific guidelines to those with heart or lung disease.  Flowsheet Row Cardiac Rehab from 01/30/2022 in [facility] Cardiac and Pulmonary Rehab  Date 01/30/22  Educator [facility]  Instruction Review Code 1- Verbalizes Understanding       Education: Flexibility, Balance, Mind/Body Relaxation: - Group verbal and visual presentation with interactive activity on the components of exercise prescription. Introduces F.I.T.T principle from ACSM for exercise prescriptions. Reviews F.I.T.T. principles of flexibility and  balance exercise training including progression. Also discusses the mind body connection.  Reviews various relaxation techniques to help reduce and manage stress (i.e. Deep breathing, progressive muscle relaxation, and visualization).  Balance handout provided to take home. Written material given at graduation.   Activity Barriers & Risk Stratification:  Activity Barriers & Cardiac Risk Stratification - 12/20/21 1138       Activity Barriers & Cardiac Risk Stratification   Activity Barriers Back Problems;Deconditioning    Cardiac Risk Stratification Moderate             6 Minute Walk:  6 Minute Walk     Row Name 12/20/21 1137         6 Minute Walk   Phase Initial     Distance 1675 feet     Walk Time 6 minutes     # of Rest Breaks 0     MPH 3.17     METS 3.69     RPE 7     VO2 Peak 12.87     Symptoms No     Resting HR 57 bpm     Resting BP 128/64     Resting Oxygen Saturation  98 %     Exercise Oxygen Saturation  during 6 min walk 97 %     Max Ex. HR 93 bpm     Max Ex. BP 128/74     2 Minute Post BP 112/62              Oxygen Initial Assessment:   Oxygen Re-Evaluation:   Oxygen Discharge (Final Oxygen Re-Evaluation):   Initial Exercise Prescription:  Initial Exercise Prescription - 12/20/21 1100       Date of Initial Exercise RX and Referring Provider   Date 12/20/21    Referring Provider Callwood, Dwayne MD      Oxygen   Maintain Oxygen Saturation 88% or higher      Treadmill   MPH 3.3    Grade 1    Minutes 15    METs 3.98      REL-XR   Level 3    Speed 50    Minutes 15    METs 3      T5 Nustep   Level 3    SPM 80    Minutes 15    METs 3      Track   Laps 42    Minutes 15    METs 3.28      Prescription Details   Frequency (times per week) 3    Duration Progress to 30 minutes of continuous aerobic without signs/symptoms of physical distress      Intensity   THRR 40-80% of Max Heartrate 95-133    Ratings of Perceived Exertion 11-13    Perceived Dyspnea 0-4      Progression   Progression Continue to progress workloads to maintain intensity without signs/symptoms of physical distress.      Resistance Training   Training Prescription Yes    Weight 5  lb    Reps 10-15             Perform Capillary Blood Glucose checks as needed.  Exercise Prescription Changes:   Exercise Prescription Changes     Row Name 12/20/21 1100 01/01/22 1400 01/04/22 0800 01/15/22 1400 01/28/22 1100     Response to Exercise   Blood Pressure (Admit) 128/64 122/62 -- 118/72 102/60   Blood Pressure (Exercise) 128/74 142/80 -- 160/80 --   Blood Pressure (Exit)   112/64 106/62 -- 112/68 120/60   Heart Rate (Admit) 57 bpm 55 bpm -- 51 bpm 59 bpm   Heart Rate (Exercise) 93 bpm 111 bpm -- 133 bpm 132 bpm   Heart Rate (Exit) 68 bpm 76 bpm -- 68 bpm 65 bpm   Oxygen Saturation (Admit) 98 % -- -- -- --   Oxygen Saturation (Exercise) 97 % -- -- -- --   Rating of Perceived Exertion (Exercise) 7 13 -- 13 13   Symptoms none none -- none none   Comments walk test results First three days of exercise -- -- --   Duration -- Continue with 30 min of aerobic exercise without signs/symptoms of physical distress. -- Continue with 30 min of aerobic exercise without signs/symptoms of physical distress. Continue with 30 min of aerobic exercise without signs/symptoms of physical distress.   Intensity -- THRR unchanged -- THRR unchanged THRR unchanged     Progression   Progression -- Continue to progress workloads to maintain intensity without signs/symptoms of physical distress. -- Continue to progress workloads to maintain intensity without signs/symptoms of physical distress. Continue to progress workloads to maintain intensity without signs/symptoms of physical distress.   Average METs -- 3.66 -- 4.69 6.16     Resistance Training   Training Prescription -- Yes -- Yes Yes   Weight -- 5 lb -- 5 lb 6 lb   Reps -- 10-15 -- 10-15 10-15     Interval Training   Interval Training -- No -- No Yes   Equipment -- -- -- -- Treadmill   Comments -- -- -- -- 4-8.5% incline     Treadmill   MPH -- 3.6 -- 6.5 4.6  intervals   Grade -- 3 -- 3 7.5   Minutes -- 15 -- 15 15   METs -- 5.25  -- 8.67 11.2     Elliptical   Level -- -- -- -- 1   Speed -- -- -- -- 2.5   Minutes -- -- -- -- 15     REL-XR   Level -- 3 -- 3 7   Minutes -- 15 -- 15 15   METs -- 4.2 -- 5.7 7.4     T5 Nustep   Level -- 3 -- 5 4   Minutes -- 15 -- 15 15   METs -- 2.6 -- 3.8 3.2     Track   Laps -- 36 -- 45 --   Minutes -- 15 -- 15 --   METs -- 2.96 -- 3.45 --     Home Exercise Plan   Plans to continue exercise at -- -- Home (comment)  walking, running Home (comment)  walking, running Home (comment)  walking, running   Frequency -- -- Add 2 additional days to program exercise sessions. Add 2 additional days to program exercise sessions. Add 2 additional days to program exercise sessions.   Initial Home Exercises Provided -- -- 01/04/22 01/04/22 01/04/22     Oxygen   Maintain Oxygen Saturation -- 88% or higher -- 88% or higher 88% or higher            Exercise Comments:   Exercise Comments     Row Name 12/24/21 0733           Exercise Comments First full day of exercise!  Patient was oriented to gym and equipment including functions, settings, policies, and procedures.  Patient's individual exercise prescription and treatment plan were reviewed.  All starting workloads were established based on the   results of the 6 minute walk test done at initial orientation visit.  The plan for exercise progression was also introduced and progression will be customized based on patient's performance and goals.                Exercise Goals and Review:   Exercise Goals     Row Name 12/20/21 1143             Exercise Goals   Increase Physical Activity Yes       Intervention Provide advice, education, support and counseling about physical activity/exercise needs.;Develop an individualized exercise prescription for aerobic and resistive training based on initial evaluation findings, risk stratification, comorbidities and participant's personal goals.       Expected Outcomes Short Term:  Attend rehab on a regular basis to increase amount of physical activity.;Long Term: Add in home exercise to make exercise part of routine and to increase amount of physical activity.;Long Term: Exercising regularly at least 3-5 days a week.       Increase Strength and Stamina Yes       Intervention Provide advice, education, support and counseling about physical activity/exercise needs.;Develop an individualized exercise prescription for aerobic and resistive training based on initial evaluation findings, risk stratification, comorbidities and participant's personal goals.       Expected Outcomes Short Term: Increase workloads from initial exercise prescription for resistance, speed, and METs.;Short Term: Perform resistance training exercises routinely during rehab and add in resistance training at home;Long Term: Improve cardiorespiratory fitness, muscular endurance and strength as measured by increased METs and functional capacity (6MWT)       Able to understand and use rate of perceived exertion (RPE) scale Yes       Intervention Provide education and explanation on how to use RPE scale       Expected Outcomes Short Term: Able to use RPE daily in rehab to express subjective intensity level;Long Term:  Able to use RPE to guide intensity level when exercising independently       Able to understand and use Dyspnea scale Yes       Intervention Provide education and explanation on how to use Dyspnea scale       Expected Outcomes Short Term: Able to use Dyspnea scale daily in rehab to express subjective sense of shortness of breath during exertion;Long Term: Able to use Dyspnea scale to guide intensity level when exercising independently       Knowledge and understanding of Target Heart Rate Range (THRR) Yes       Intervention Provide education and explanation of THRR including how the numbers were predicted and where they are located for reference       Expected Outcomes Short Term: Able to state/look up  THRR;Short Term: Able to use daily as guideline for intensity in rehab;Long Term: Able to use THRR to govern intensity when exercising independently       Able to check pulse independently Yes       Intervention Provide education and demonstration on how to check pulse in carotid and radial arteries.;Review the importance of being able to check your own pulse for safety during independent exercise       Expected Outcomes Long Term: Able to check pulse independently and accurately;Short Term: Able to explain why pulse checking is important during independent exercise       Understanding of Exercise Prescription Yes       Intervention Provide education, explanation, and written materials on patient's individual exercise   prescription       Expected Outcomes Long Term: Able to explain home exercise prescription to exercise independently;Short Term: Able to explain program exercise prescription                Exercise Goals Re-Evaluation :  Exercise Goals Re-Evaluation     Row Name 12/24/21 0733 01/01/22 1457 01/04/22 0807 01/15/22 1416 01/28/22 1153     Exercise Goal Re-Evaluation   Exercise Goals Review Increase Physical Activity;Able to understand and use rate of perceived exertion (RPE) scale;Increase Strength and Stamina;Able to understand and use Dyspnea scale;Knowledge and understanding of Target Heart Rate Range (THRR);Understanding of Exercise Prescription Increase Physical Activity;Increase Strength and Stamina;Understanding of Exercise Prescription Increase Physical Activity;Increase Strength and Stamina;Understanding of Exercise Prescription;Able to understand and use rate of perceived exertion (RPE) scale;Knowledge and understanding of Target Heart Rate Range (THRR);Able to understand and use Dyspnea scale;Able to check pulse independently Increase Physical Activity;Increase Strength and Stamina;Understanding of Exercise Prescription;Able to understand and use rate of perceived exertion  (RPE) scale;Knowledge and understanding of Target Heart Rate Range (THRR);Able to understand and use Dyspnea scale;Able to check pulse independently Increase Physical Activity;Increase Strength and Stamina;Understanding of Exercise Prescription   Comments Reviewed RPE and dyspnea scales, THR and program prescription with pt today.  Pt voiced understanding and was given a copy of goals to take home. Jose Mckenzie is off to a good start in rehab. He had an overall average MET level of 3.66 METs. He also increased his workload on the treadmill to a speed of 3.6 mph and an incline of 3.0%. He has tolerated using 5 lb hand weights as well. We will continue to monitor his progress in the program. Reviewed home exercise with pt today.  Pt plans to walk and run at home for exercise.  Reviewed THR, pulse, RPE, sign and symptoms, pulse oximetery and when to call 911 or MD.  Also discussed weather considerations and indoor options.  Pt voiced understanding. Jose Mckenzie to do well in rehab. He recently increased his overall average MET level to 4.69 METs. He also increased his workload on the treadmill to a speed of 6.5 mph and an incline of 3%. He also got up to 45 laps on the track as well. We will continue to monitor his progress. Jose Mckenzie to do well in rehab. He has increased to level 7 on the XR and started doing intervals on the treadmill, ranging anywhere between 3.3-4.6 mph and 3-8.5% incline. He tried the elliptical and was able to work on level 1. His handweights for resistance training are now at 6 lbs. We will continue to monitor.   Expected Outcomes Short: Use RPE daily to regulate intensity. Long: Follow program prescription in THR. Short: Continue to increase workloads. Long: Continue to increase strength and stamina. Short: Start to add in more running Long:Continue to improve stamina Short: try 6 lb weights. Long:Continue to improve strength and stamina. Short: Continue working on elliptical, increase workload  when appropriate Long: Continue to increase overall MET level            Discharge Exercise Prescription (Final Exercise Prescription Changes):  Exercise Prescription Changes - 01/28/22 1100       Response to Exercise   Blood Pressure (Admit) 102/60    Blood Pressure (Exit) 120/60    Heart Rate (Admit) 59 bpm    Heart Rate (Exercise) 132 bpm    Heart Rate (Exit) 65 bpm    Rating of Perceived Exertion (Exercise) 13      Symptoms none    Duration Continue with 30 min of aerobic exercise without signs/symptoms of physical distress.    Intensity THRR unchanged      Progression   Progression Continue to progress workloads to maintain intensity without signs/symptoms of physical distress.    Average METs 6.16      Resistance Training   Training Prescription Yes    Weight 6 lb    Reps 10-15      Interval Training   Interval Training Yes    Equipment Treadmill    Comments 4-8.5% incline      Treadmill   MPH 4.6   intervals   Grade 7.5    Minutes 15    METs 11.2      Elliptical   Level 1    Speed 2.5    Minutes 15      REL-XR   Level 7    Minutes 15    METs 7.4      T5 Nustep   Level 4    Minutes 15    METs 3.2      Home Exercise Plan   Plans to continue exercise at Home (comment)   walking, running   Frequency Add 2 additional days to program exercise sessions.    Initial Home Exercises Provided 01/04/22      Oxygen   Maintain Oxygen Saturation 88% or higher             Nutrition:  Target Goals: Understanding of nutrition guidelines, daily intake of sodium <1500mg, cholesterol <200mg, calories 30% from fat and 7% or less from saturated fats, daily to have 5 or more servings of fruits and vegetables.  Education: All About Nutrition: -Group instruction provided by verbal, written material, interactive activities, discussions, models, and posters to present general guidelines for heart healthy nutrition including fat, fiber, MyPlate, the role of sodium  in heart healthy nutrition, utilization of the nutrition label, and utilization of this knowledge for meal planning. Follow up email sent as well. Written material given at graduation. Flowsheet Row Cardiac Rehab from 01/30/2022 in [facility] Cardiac and Pulmonary Rehab  Education need identified 12/20/21  Date 12/26/21  Educator [facility]  Instruction Review Code 1- Verbalizes Understanding       Biometrics:  Pre Biometrics - 12/20/21 1143       Pre Biometrics   Height 5' 10" (1.778 m)    Weight 185 lb 4.8 oz (84.1 kg)    Waist Circumference 35.5 inches    Hip Circumference 39 inches    Waist to Hip Ratio 0.91 %    BMI (Calculated) 26.59    Single Leg Stand 30 seconds              Nutrition Therapy Plan and Nutrition Goals:  Nutrition Therapy & Goals - 12/26/21 1428       Nutrition Therapy   RD appointment deferred Yes   Pt feels he is doing well with his nutrition and is not interested in meeting with RD at this time. He came to nutrition education 12/26/21. Will continue to follow up.     Personal Nutrition Goals   Nutrition Goal Pt feels he is doing well with his nutrition and is not interested in meeting with RD at this time. He came to nutrition education 12/26/21. Will continue to follow up.             Nutrition Assessments:  MEDIFICTS Score Key: ?70 Need to make dietary changes  40-70 Heart Healthy   Diet ? 40 Therapeutic Level Cholesterol Diet  Flowsheet Row Cardiac Rehab from 12/20/2021 in [facility] Cardiac and Pulmonary Rehab  Picture Your Plate Total Score on Admission 85      Picture Your Plate Scores: <16 Unhealthy dietary pattern with much room for improvement. 41-50 Dietary pattern unlikely to meet recommendations for good health and room for improvement. 51-60 More healthful dietary pattern, with some room for improvement.  >60 Healthy dietary pattern, although there may be some specific behaviors that could be improved.    Nutrition Goals Re-Evaluation:   Nutrition Goals Re-Evaluation     [place]ba Name 01/14/22 0727             Goals   Nutrition Goal Pt feels he is doing well with his nutrition and is not interested in meeting with RD at this time.                Nutrition Goals Discharge (Final Nutrition Goals Re-Evaluation):  Nutrition Goals Re-Evaluation - 01/14/22 0727       Goals   Nutrition Goal Pt feels he is doing well with his nutrition and is not interested in meeting with RD at this time.             Psychosocial: Target Goals: Acknowledge presence or absence of significant depression and/or stress, maximize coping skills, provide positive support system. Participant is able to verbalize types and ability to use techniques and skills needed for reducing stress and depression.   Education: Stress, Anxiety, and Depression - Group verbal and visual presentation to define topics covered.  Reviews how body is impacted by stress, anxiety, and depression.  Also discusses healthy ways to reduce stress and to treat/manage anxiety and depression.  Written material given at graduation. Flowsheet Row Cardiac Rehab from 01/30/2022 in [facility] Cardiac and Pulmonary Rehab  Date 01/23/22  Educator [facility]  Instruction Review Code 1- [employer] Understanding       Education: Sleep Hygiene -Provides group verbal and written instruction about how sleep can affect your health.  Define sleep hygiene, discuss sleep cycles and impact of sleep habits. Review good sleep hygiene tips.    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 Yes   wife, friends     Barriers   Psychosocial barriers to participate in program There are no identifiable barriers or psychosocial  needs.      Screening Interventions   Interventions Encouraged to exercise;To provide support and resources with identified psychosocial needs;Provide feedback about the scores to participant    Expected Outcomes Short Term goal: Utilizing psychosocial counselor, staff and physician to assist with identification of specific Stressors or current issues interfering with healing process. Setting desired goal for each stressor or current issue identified.;Long Term Goal: Stressors or current issues are controlled or eliminated.;Short Term goal: Identification and review with participant of any Quality of Life or Depression concerns found by scoring the questionnaire.;Long Term goal: The participant improves quality of Life and PHQ9 Scores as seen by post scores and/or verbalization of changes             Quality of Life Scores:   Quality of Life - 12/20/21  1144       Quality of Life   Select Quality of Life      Quality of Life Scores   Health/Function Pre 29.83 %    Socioeconomic Pre 30 %    Psych/Spiritual Pre 30 %    Family Pre 30 %    GLOBAL Pre 29.93 %            Scores of 19 and below usually indicate a poorer quality of life in these areas.  A difference of  2-3 points is a clinically meaningful difference.  A difference of 2-3 points in the total score of the Quality of Life Index has been associated with significant improvement in overall quality of life, self-image, physical symptoms, and general health in studies assessing change in quality of life.  PHQ-9: Review Flowsheet       12/20/2021  Depression screen PHQ 2/9  Decreased Interest 0  Down, Depressed, Hopeless 0  PHQ - 2 Score 0  Altered sleeping 0  Tired, decreased energy 0  Change in appetite 0  Feeling bad or failure about yourself  0  Trouble concentrating 0  Moving slowly or fidgety/restless 0  Suicidal thoughts 0  PHQ-9 Score 0  Difficult doing work/chores Not difficult at all   Interpretation of  Total Score  Total Score Depression Severity:  1-4 = Minimal depression, 5-9 = Mild depression, 10-14 = Moderate depression, 15-19 = Moderately severe depression, 20-27 = Severe depression   Psychosocial Evaluation and Intervention:  Psychosocial Evaluation - 12/14/21 1320       Psychosocial Evaluation & Interventions   Interventions Encouraged to exercise with the program and follow exercise prescription    Comments Rutledge has no barriers to attending the program. 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 This can be long hours and time at home to keep the class running. He plans to retire in Dec this year. He lives with his wife and she and some friends are his support. He wants to be able to get back to doing all his previous activities. He stated that his doctor told hm he will reach that goal.    Expected Outcomes STG: Shameek will be able to attend all scheduled sessions, he will progress with his exercise. LTG Dustin will continue his exercise progression after discharge    Continue Psychosocial Services  Follow up required by staff             Psychosocial Re-Evaluation:  Psychosocial Re-Evaluation     Row Name 01/14/22 0728             Psychosocial Re-Evaluation   Current issues with Current Stress Concerns       Comments Patient reports no new changes in sleep, stress, or mental health. He report that his job can be stressful and he works long days, but he is active at work. He is planning to retire in December and will then be able to focus more on his exercise routine at home and managing his health.       Expected Outcomes Short: Continue to attend cardiac rehab classes consistently and work towards retirement. Long: Continune good mental health habits.       Continue Psychosocial Services  Follow up required by staff                Psychosocial Discharge (Final Psychosocial Re-Evaluation):  Psychosocial Re-Evaluation - 01/14/22 0728        Psychosocial   Re-Evaluation   Current issues with Current Stress Concerns    Comments Patient reports no new changes in sleep, stress, or mental health. He report that his job can be stressful and he works long days, but he is active at work. He is planning to retire in December and will then be able to focus more on his exercise routine at home and managing his health.    Expected Outcomes Short: Continue to attend cardiac rehab classes consistently and work towards retirement. Long: Continune good mental health habits.    Continue Psychosocial Services  Follow up required by staff             Vocational Rehabilitation: Provide vocational rehab assistance to qualifying candidates.   Vocational Rehab Evaluation & Intervention:  Vocational Rehab - 12/20/21 1145       Initial Vocational Rehab Evaluation & Intervention   Assessment shows need for Vocational Rehabilitation No   Already returned to work            Education: Education Goals: Education classes will be provided on a variety of topics geared toward better understanding of heart health and risk factor modification. Participant will state understanding/return demonstration of topics presented as noted by education test scores.  Learning Barriers/Preferences:   General Cardiac Education Topics:  AED/CPR: - Group verbal and written instruction with the use of models to demonstrate the basic use of the AED with the basic ABC's of resuscitation.   Anatomy and Cardiac Procedures: - Group verbal and visual presentation and models provide information about basic cardiac anatomy and function. Reviews the testing methods done to diagnose heart disease and the outcomes of the test results. Describes the treatment choices: Medical Management, Angioplasty, or Coronary Bypass Surgery for treating various heart conditions including Myocardial Infarction, Angina, Valve Disease, and Cardiac Arrhythmias.  Written material given at  graduation. Flowsheet Row Cardiac Rehab from 01/30/2022 in [facility]  Education need identified 12/20/21       Medication Safety: - Group verbal and visual instruction to review commonly prescribed medications for heart and lung disease. Reviews the medication, class of the drug, and side effects. Includes the steps to properly store meds and maintain the prescription regimen.  Written material given at graduation. Flowsheet Row Cardiac Rehab from 01/30/2022 in [facility]  Date 01/02/22  Educator SB  Instruction Review Code 1- Verbalizes Understanding       Intimacy: - Group verbal instruction through game format to discuss how heart and lung disease can affect sexual intimacy. Written material given at graduation..   Know Your Numbers and Heart Failure: - Group verbal and visual instruction to discuss disease risk factors for cardiac and pulmonary disease and treatment options.  Reviews associated critical values for Overweight/Obesity, Hypertension, Cholesterol, and Diabetes.  Discusses basics of heart failure: signs/symptoms and treatments.  Introduces Heart Failure Zone chart for action plan for heart failure.  Written material given at graduation. Flowsheet Row Cardiac Rehab from 01/30/2022 in [facility]  Education need identified 12/20/21  Date 01/09/22  Educator SB  Instruction Review Code 1- Verbalizes Understanding       Infection Prevention: - Provides verbal and written material to individual with discussion of infection control including proper hand washing and proper equipment cleaning during exercise session. Flowsheet Row Cardiac Rehab from 01/30/2022 in [facility]  Date 12/20/21  Educator JH  Instruction Review Code 1- Verbalizes Understanding         Falls Prevention: - Provides verbal and written material to individual with discussion of falls prevention and  safety. Flowsheet Row Cardiac Rehab from 01/30/2022 in [facility]  Date 12/14/21  Educator SB  Instruction Review Code 1- Verbalizes Understanding       Other: -Provides group and verbal instruction on various topics (see comments)   Knowledge Questionnaire Score:  Knowledge Questionnaire Score - 12/20/21 1145       Knowledge Questionnaire Score   Pre Score 22/26             Core Components/Risk Factors/Patient Goals at Admission:  Personal Goals and Risk Factors at Admission - 12/20/21 1148       Core Components/Risk Factors/Patient Goals on Admission    Weight Management Yes;Weight Loss    Intervention Weight Management: Develop a combined nutrition and exercise program designed to reach desired caloric intake, while maintaining appropriate intake of nutrient and fiber, sodium and fats, and appropriate energy expenditure required for the weight goal.;Weight Management: Provide education and appropriate resources to help participant work on and attain dietary goals.    Admit Weight 185 lb 4.8 oz (84.1 kg)    Goal Weight: Short Term 180 lb (81.6 kg)    Goal Weight: Long Term 174 lb (78.9 kg)    Expected Outcomes Short Term: Continue to assess and modify interventions until short term weight is achieved;Long Term: Adherence to nutrition and physical activity/exercise program aimed toward attainment of established weight goal;Weight Loss: Understanding of general recommendations for a balanced deficit meal plan, which promotes 1-2 lb weight loss per week and includes a negative energy balance of 500-1000 kcal/d;Understanding recommendations for meals to include 15-35% energy as protein, 25-35% energy from fat, 35-60% energy from carbohydrates, less than 200mg of dietary cholesterol, 20-35 gm of total fiber daily;Understanding of distribution of calorie intake throughout the day with the consumption of 4-5 meals/snacks    Hypertension Yes    Intervention  Provide education on lifestyle modifcations including regular physical activity/exercise, weight management, moderate sodium restriction and increased consumption of fresh fruit, vegetables, and low fat dairy, alcohol moderation, and smoking cessation.;Monitor prescription use compliance.    Expected Outcomes Short Term: Continued assessment and intervention until BP is < 140/90mm HG in hypertensive participants. < 130/80mm HG in hypertensive participants with diabetes, heart failure or chronic kidney disease.;Long Term: Maintenance of blood pressure at goal levels.    Lipids Yes    Intervention Provide education and support for participant on nutrition & aerobic/resistive exercise along with prescribed medications to achieve LDL <70mg, HDL >40mg.    Expected Outcomes Short Term: Participant states understanding of desired cholesterol values and is compliant with medications prescribed. Participant is following exercise prescription and nutrition guidelines.;Long Term: Cholesterol controlled with medications as prescribed, with individualized exercise RX and with personalized nutrition plan. Value goals: LDL < 70mg, HDL > 40 mg.             Education:Diabetes - Individual verbal and written instruction to review signs/symptoms of diabetes, desired ranges of glucose level fasting, after meals and with exercise. Acknowledge that pre and post exercise glucose checks will be done for 3 sessions at entry of program.   Core Components/Risk Factors/Patient Goals Review:   Goals and Risk Factor Review     Row Name 01/14/22 0725             Core Components/Risk Factors/Patient Goals Review   Personal Goals Review Weight Management/Obesity;Hypertension;Lipids       Review   Patient reports that he takes all medications as prescribed. His blood pressures have been in acceptable ranges and his weight has been steady. He attends cardiac rehab regularly and [place] in an active lifestyle to help  maintain weight and control risk factors.       Expected Outcomes Short: continue to attend cardiac rehab classes with consistent attendance. Long: monitor and control cardiac risk factors with healthy lifestyle choices.                Core Components/Risk Factors/Patient Goals at Discharge (Final Review):   Goals and Risk Factor Review - 01/14/22 0725       Core Components/Risk Factors/Patient Goals Review   Personal Goals Review Weight Management/Obesity;Hypertension;Lipids    Review Patient reports that he takes all medications as prescribed. His blood pressures have been in acceptable ranges and his weight has been steady. He attends cardiac rehab regularly and [place] in an active lifestyle to help maintain weight and control risk factors.    Expected Outcomes Short: continue to attend cardiac rehab classes with consistent attendance. Long: monitor and control cardiac risk factors with healthy lifestyle choices.             ITP Comments:  ITP Comments     Row Name 12/14/21 1329 12/20/21 1136 12/24/21 0733 01/02/22 0656 01/30/22 0910   ITP Comments Virtual orientation call completed today. he has an appointment on Date: 12/20/2021  for EP eval and gym Orientation.  Documentation of diagnosis can be found in [facility] Date: 11/01/2021 . Completed 6MWT and gym orientation. Initial ITP created and sent for review to Dr. Mark Miller, Medical Director. First full day of exercise!  Patient was oriented to gym and equipment including functions, settings, policies, and procedures.  Patient's individual exercise prescription and treatment plan were reviewed.  All starting workloads were established based on the results of the 6 minute walk test done at initial orientation visit.  The plan for exercise progression was also introduced and progression will be customized based on patient's performance and goals. 30 Day review completed. Medical Director ITP review done, changes made as directed, and  signed approval by Medical Director.   New to program 30 Day review completed. Medical Director ITP review done, changes made as directed, and signed approval by Medical Director.            Comments:  

## 2022-02-01 ENCOUNTER — Encounter: Payer: BC Managed Care – PPO | Admitting: *Deleted

## 2022-02-01 DIAGNOSIS — Z955 Presence of coronary angioplasty implant and graft: Secondary | ICD-10-CM | POA: Diagnosis not present

## 2022-02-01 NOTE — Progress Notes (Signed)
Daily Session Note  Patient Details  Name: Jose Mckenzie MRN: [id number] Date of Birth: 1955/11/14 Referring Provider:   Flowsheet Row Cardiac Rehab from 12/20/2021 in [facility]  Referring Provider [name] MD       Encounter Date: 02/01/2022  Check In:  Session Check In - 02/01/22 0826       Check-In   Supervising physician immediately available to respond to emergencies See telemetry face sheet for immediately available ER MD    Location [facility]    Staff Present [name], RN, BSN, CCRP;Jessica [place], MA, RCEP, CCRP, CCET;Joseph [place], [place]    Virtual Visit No    Medication changes reported     No    Fall or balance concerns reported    No    Warm-up and Cool-down Performed on first and last piece of equipment    Resistance Training Performed Yes    VAD Patient? No    PAD/SET Patient? No      Pain Assessment   Currently in Pain? No/denies                Social History   Tobacco Use  Smoking Status Never  Smokeless Tobacco Never    Goals Met:  Independence with exercise equipment Exercise tolerated well No report of concerns or symptoms today  Goals Unmet:  Not Applicable  Comments: Pt able to follow exercise prescription today without complaint.  Will continue to monitor for progression.    Dr. Emily Filbert is Medical Director for [place].  Dr. Ottie Glazier is Medical Director for [facility] Pulmonary Rehabilitation.

## 2022-02-04 ENCOUNTER — Encounter: Payer: BC Managed Care – PPO | Admitting: *Deleted

## 2022-02-04 DIAGNOSIS — Z955 Presence of coronary angioplasty implant and graft: Secondary | ICD-10-CM | POA: Diagnosis not present

## 2022-02-04 NOTE — Progress Notes (Signed)
Daily Session Note  Patient Details  Name: Jose Mckenzie MRN: [id number] Date of Birth: December 14, 1955 Referring Provider:   Flowsheet Row Cardiac Rehab from 12/20/2021 in [facility]  Referring Provider [name] MD       Encounter Date: 02/04/2022  Check In:  Session Check In - 02/04/22 0729       Check-In   Supervising physician immediately available to respond to emergencies See telemetry face sheet for immediately available ER MD    Location [facility]    Staff Present Earlean Shawl, BS, ACSM CEP, Exercise Physiologist;Joseph Rosebud Poles, RN, [place]    Virtual Visit No    Medication changes reported     No    Fall or balance concerns reported    No    Warm-up and Cool-down Performed on first and last piece of equipment    Resistance Training Performed Yes    VAD Patient? No    PAD/SET Patient? No      Pain Assessment   Currently in Pain? No/denies                Social History   Tobacco Use  Smoking Status Never  Smokeless Tobacco Never    Goals Met:  Independence with exercise equipment Exercise tolerated well No report of concerns or symptoms today Strength training completed today  Goals Unmet:  Not Applicable  Comments: Pt able to follow exercise prescription today without complaint.  Will continue to monitor for progression.    Dr. Emily Filbert is Medical Director for [place].  Dr. Ottie Glazier is Medical Director for [facility] Pulmonary Rehabilitation.

## 2022-02-11 ENCOUNTER — Encounter: Payer: BC Managed Care – PPO | Admitting: *Deleted

## 2022-02-11 DIAGNOSIS — Z955 Presence of coronary angioplasty implant and graft: Secondary | ICD-10-CM

## 2022-02-11 NOTE — Progress Notes (Signed)
Daily Session Note  Patient Details  Name: Jose Mckenzie MRN: [id number] Date of Birth: 02-05-1956 Referring Provider:   Flowsheet Row Cardiac Rehab from 12/20/2021 in [facility]  Referring Provider [name] MD       Encounter Date: 02/11/2022  Check In:  Session Check In - 02/11/22 0751       Check-In   Supervising physician immediately available to respond to emergencies See telemetry face sheet for immediately available ER MD    Location [facility]    Staff Present Earlean Shawl, BS, ACSM CEP, Exercise Physiologist;Jessica [place], MA, RCEP, CCRP, CCET;Joseph [place], [place], RN, [place]    Virtual Visit No    Medication changes reported     No    Fall or balance concerns reported    No    Warm-up and Cool-down Performed on first and last piece of equipment    Resistance Training Performed Yes    VAD Patient? No    PAD/SET Patient? No      Pain Assessment   Currently in Pain? No/denies                Social History   Tobacco Use  Smoking Status Never  Smokeless Tobacco Never    Goals Met:  Independence with exercise equipment Exercise tolerated well No report of concerns or symptoms today Strength training completed today  Goals Unmet:  Not Applicable  Comments: Pt able to follow exercise prescription today without complaint.  Will continue to monitor for progression.    Dr. Emily Filbert is Medical Director for [place].  Dr. Ottie Glazier is Medical Director for [facility] Pulmonary Rehabilitation.

## 2022-02-27 ENCOUNTER — Encounter: Payer: Self-pay | Admitting: *Deleted

## 2022-02-27 DIAGNOSIS — Z955 Presence of coronary angioplasty implant and graft: Secondary | ICD-10-CM

## 2022-02-27 NOTE — Progress Notes (Signed)
Cardiac Individual Treatment Plan  Patient Details  Name: Jose Mckenzie MRN: [id number] Date of Birth: 05/26/1955 Referring Provider:   Flowsheet Row Cardiac Rehab from 12/20/2021 in [facility] Cardiac and Pulmonary Rehab  Referring Provider [name] MD       Initial Encounter Date:  Flowsheet Row Cardiac Rehab from 12/20/2021 in [facility] Cardiac and Pulmonary Rehab  Date 12/20/21       Visit Diagnosis: Status post coronary artery stent placement  Patient's Home Medications on Admission:  Current Outpatient Medications:    aspirin EC 81 MG tablet, Take 1 tablet (81 mg total) by mouth daily. Swallow whole., Disp: 90 tablet, Rfl: 3   losartan (COZAAR) 50 MG tablet, Take 1 tablet (50 mg total) by mouth daily., Disp: 30 tablet, Rfl: 11   metoprolol succinate (TOPROL XL) 25 MG 24 hr tablet, Take 1 tablet (25 mg total) by mouth daily., Disp: 30 tablet, Rfl: 11   rosuvastatin (CRESTOR) 20 MG tablet, Take 2 tablets (40 mg total) by mouth daily., Disp: 30 tablet, Rfl: 11   spironolactone (ALDACTONE) 25 MG tablet, Take 12.5 mg by mouth daily., Disp: , Rfl:    ticagrelor (BRILINTA) 90 MG TABS tablet, Take 1 tablet (90 mg total) by mouth 2 (two) times daily., Disp: 60 tablet, Rfl: 0  Past Medical History: Past Medical History:  Diagnosis Date   Hx of dysplastic nevus 2015   multiple sites    Tobacco Use: Social History   Tobacco Use  Smoking Status Never  Smokeless Tobacco Never    Labs: Review Flowsheet        No data to display           Exercise Target Goals: Exercise Program Goal: Individual exercise prescription set using results from initial 6 min walk test and THRR while considering  patient's activity barriers and safety.   Exercise Prescription Goal: Initial exercise prescription builds to 30-45 minutes a day of aerobic activity, 2-3 days per week.  Home exercise guidelines will be given to patient during program as part of exercise prescription that the  participant will acknowledge.   Education: Aerobic Exercise: - Group verbal and visual presentation on the components of exercise prescription. Introduces F.I.T.T principle from ACSM for exercise prescriptions.  Reviews F.I.T.T. principles of aerobic exercise including progression. Written material given at graduation.   Education: Resistance Exercise: - Group verbal and visual presentation on the components of exercise prescription. Introduces F.I.T.T principle from ACSM for exercise prescriptions  Reviews F.I.T.T. principles of resistance exercise including progression. Written material given at graduation.    Education: Exercise & Equipment Safety: - Individual verbal instruction and demonstration of equipment use and safety with use of the equipment. Flowsheet Row Cardiac Rehab from 01/30/2022 in [facility] Cardiac and Pulmonary Rehab  Date 12/20/21  Educator [facility]  Instruction Review Code 1- Verbalizes Understanding       Education: Exercise Physiology & General Exercise Guidelines: - Group verbal and written instruction with models to review the exercise physiology of the cardiovascular system and associated critical values. Provides general exercise guidelines with specific guidelines to those with heart or lung disease.  Flowsheet Row Cardiac Rehab from 01/30/2022 in [facility] Cardiac and Pulmonary Rehab  Date 01/30/22  Educator [facility]  Instruction Review Code 1- Verbalizes Understanding       Education: Flexibility, Balance, Mind/Body Relaxation: - Group verbal and visual presentation with interactive activity on the components of exercise prescription. Introduces F.I.T.T principle from ACSM for exercise prescriptions. Reviews F.I.T.T. principles of flexibility and  balance exercise training including progression. Also discusses the mind body connection.  Reviews various relaxation techniques to help reduce and manage stress (i.e. Deep breathing, progressive muscle relaxation, and visualization).  Balance handout provided to take home. Written material given at graduation.   Activity Barriers & Risk Stratification:  Activity Barriers & Cardiac Risk Stratification - 12/20/21 1138       Activity Barriers & Cardiac Risk Stratification   Activity Barriers Back Problems;Deconditioning    Cardiac Risk Stratification Moderate             6 Minute Walk:  6 Minute Walk     Row Name 12/20/21 1137         6 Minute Walk   Phase Initial     Distance 1675 feet     Walk Time 6 minutes     # of Rest Breaks 0     MPH 3.17     METS 3.69     RPE 7     VO2 Peak 12.87     Symptoms No     Resting HR 57 bpm     Resting BP 128/64     Resting Oxygen Saturation  98 %     Exercise Oxygen Saturation  during 6 min walk 97 %     Max Ex. HR 93 bpm     Max Ex. BP 128/74     2 Minute Post BP 112/62              Oxygen Initial Assessment:   Oxygen Re-Evaluation:   Oxygen Discharge (Final Oxygen Re-Evaluation):   Initial Exercise Prescription:  Initial Exercise Prescription - 12/20/21 1100       Date of Initial Exercise RX and Referring Provider   Date 12/20/21    Referring Provider Callwood, Dwayne MD      Oxygen   Maintain Oxygen Saturation 88% or higher      Treadmill   MPH 3.3    Grade 1    Minutes 15    METs 3.98      REL-XR   Level 3    Speed 50    Minutes 15    METs 3      T5 Nustep   Level 3    SPM 80    Minutes 15    METs 3      Track   Laps 42    Minutes 15    METs 3.28      Prescription Details   Frequency (times per week) 3    Duration Progress to 30 minutes of continuous aerobic without signs/symptoms of physical distress      Intensity   THRR 40-80% of Max Heartrate 95-133    Ratings of Perceived Exertion 11-13    Perceived Dyspnea 0-4      Progression   Progression Continue to progress workloads to maintain intensity without signs/symptoms of physical distress.      Resistance Training   Training Prescription Yes    Weight 5  lb    Reps 10-15             Perform Capillary Blood Glucose checks as needed.  Exercise Prescription Changes:   Exercise Prescription Changes     Row Name 12/20/21 1100 01/01/22 1400 01/04/22 0800 01/15/22 1400 01/28/22 1100     Response to Exercise   Blood Pressure (Admit) 128/64 122/62 -- 118/72 102/60   Blood Pressure (Exercise) 128/74 142/80 -- 160/80 --   Blood Pressure (Exit)   112/64 106/62 -- 112/68 120/60   Heart Rate (Admit) 57 bpm 55 bpm -- 51 bpm 59 bpm   Heart Rate (Exercise) 93 bpm 111 bpm -- 133 bpm 132 bpm   Heart Rate (Exit) 68 bpm 76 bpm -- 68 bpm 65 bpm   Oxygen Saturation (Admit) 98 % -- -- -- --   Oxygen Saturation (Exercise) 97 % -- -- -- --   Rating of Perceived Exertion (Exercise) 7 13 -- 13 13   Symptoms none none -- none none   Comments walk test results First three days of exercise -- -- --   Duration -- Continue with 30 min of aerobic exercise without signs/symptoms of physical distress. -- Continue with 30 min of aerobic exercise without signs/symptoms of physical distress. Continue with 30 min of aerobic exercise without signs/symptoms of physical distress.   Intensity -- THRR unchanged -- THRR unchanged THRR unchanged     Progression   Progression -- Continue to progress workloads to maintain intensity without signs/symptoms of physical distress. -- Continue to progress workloads to maintain intensity without signs/symptoms of physical distress. Continue to progress workloads to maintain intensity without signs/symptoms of physical distress.   Average METs -- 3.66 -- 4.69 6.16     Resistance Training   Training Prescription -- Yes -- Yes Yes   Weight -- 5 lb -- 5 lb 6 lb   Reps -- 10-15 -- 10-15 10-15     Interval Training   Interval Training -- No -- No Yes   Equipment -- -- -- -- Treadmill   Comments -- -- -- -- 4-8.5% incline     Treadmill   MPH -- 3.6 -- 6.5 4.6  intervals   Grade -- 3 -- 3 7.5   Minutes -- 15 -- 15 15   METs -- 5.25  -- 8.67 11.2     Elliptical   Level -- -- -- -- 1   Speed -- -- -- -- 2.5   Minutes -- -- -- -- 15     REL-XR   Level -- 3 -- 3 7   Minutes -- 15 -- 15 15   METs -- 4.2 -- 5.7 7.4     T5 Nustep   Level -- 3 -- 5 4   Minutes -- 15 -- 15 15   METs -- 2.6 -- 3.8 3.2     Track   Laps -- 36 -- 45 --   Minutes -- 15 -- 15 --   METs -- 2.96 -- 3.45 --     Home Exercise Plan   Plans to continue exercise at -- -- Home (comment)  walking, running Home (comment)  walking, running Home (comment)  walking, running   Frequency -- -- Add 2 additional days to program exercise sessions. Add 2 additional days to program exercise sessions. Add 2 additional days to program exercise sessions.   Initial Home Exercises Provided -- -- 01/04/22 01/04/22 01/04/22     Oxygen   Maintain Oxygen Saturation -- 88% or higher -- 88% or higher 88% or higher    Row Name 02/11/22 1700             Response to Exercise   Blood Pressure (Admit) 124/70       Blood Pressure (Exit) 102/62       Heart Rate (Admit) 65 bpm       Heart Rate (Exercise) 151 bpm       Heart Rate (Exit) 69 bpm  Rating of Perceived Exertion (Exercise) 15       Symptoms none       Duration Continue with 30 min of aerobic exercise without signs/symptoms of physical distress.       Intensity THRR unchanged         Progression   Progression Continue to progress workloads to maintain intensity without signs/symptoms of physical distress.       Average METs 4.88         Resistance Training   Training Prescription Yes       Weight 6 lb       Reps 10-15         Interval Training   Interval Training Yes       Equipment Treadmill       Comments intervals of incline 5-8%, also intervals of speed 3.2-8 mph         Treadmill   MPH 3.5  intervals       Grade 5  intervals       Minutes 15       METs 6.09         Elliptical   Level 2       Speed 5.5       Minutes 15         REL-XR   Level 5       Minutes 15       METs  4.3         T5 Nustep   Level 4       Minutes 15       METs 2.9         Home Exercise Plan   Plans to continue exercise at Home (comment)  walking, running       Frequency Add 2 additional days to program exercise sessions.       Initial Home Exercises Provided 01/04/22         Oxygen   Maintain Oxygen Saturation 88% or higher                Exercise Comments:   Exercise Comments     Row Name 12/24/21 0733           Exercise Comments First full day of exercise!  Patient was oriented to gym and equipment including functions, settings, policies, and procedures.  Patient's individual exercise prescription and treatment plan were reviewed.  All starting workloads were established based on the results of the 6 minute walk test done at initial orientation visit.  The plan for exercise progression was also introduced and progression will be customized based on patient's performance and goals.                Exercise Goals and Review:   Exercise Goals     Row Name 12/20/21 1143             Exercise Goals   Increase Physical Activity Yes       Intervention Provide advice, education, support and counseling about physical activity/exercise needs.;Develop an individualized exercise prescription for aerobic and resistive training based on initial evaluation findings, risk stratification, comorbidities and participant's personal goals.       Expected Outcomes Short Term: Attend rehab on a regular basis to increase amount of physical activity.;Long Term: Add in home exercise to make exercise part of routine and to increase amount of physical activity.;Long Term: Exercising regularly at least 3-5 days a week.       Increase Strength  and Stamina Yes       Intervention Provide advice, education, support and counseling about physical activity/exercise needs.;Develop an individualized exercise prescription for aerobic and resistive training based on initial evaluation findings, risk  stratification, comorbidities and participant's personal goals.       Expected Outcomes Short Term: Increase workloads from initial exercise prescription for resistance, speed, and METs.;Short Term: Perform resistance training exercises routinely during rehab and add in resistance training at home;Long Term: Improve cardiorespiratory fitness, muscular endurance and strength as measured by increased METs and functional capacity (6MWT)       Able to understand and use rate of perceived exertion (RPE) scale Yes       Intervention Provide education and explanation on how to use RPE scale       Expected Outcomes Short Term: Able to use RPE daily in rehab to express subjective intensity level;Long Term:  Able to use RPE to guide intensity level when exercising independently       Able to understand and use Dyspnea scale Yes       Intervention Provide education and explanation on how to use Dyspnea scale       Expected Outcomes Short Term: Able to use Dyspnea scale daily in rehab to express subjective sense of shortness of breath during exertion;Long Term: Able to use Dyspnea scale to guide intensity level when exercising independently       Knowledge and understanding of Target Heart Rate Range (THRR) Yes       Intervention Provide education and explanation of THRR including how the numbers were predicted and where they are located for reference       Expected Outcomes Short Term: Able to state/look up THRR;Short Term: Able to use daily as guideline for intensity in rehab;Long Term: Able to use THRR to govern intensity when exercising independently       Able to check pulse independently Yes       Intervention Provide education and demonstration on how to check pulse in carotid and radial arteries.;Review the importance of being able to check your own pulse for safety during independent exercise       Expected Outcomes Long Term: Able to check pulse independently and accurately;Short Term: Able to explain why  pulse checking is important during independent exercise       Understanding of Exercise Prescription Yes       Intervention Provide education, explanation, and written materials on patient's individual exercise prescription       Expected Outcomes Long Term: Able to explain home exercise prescription to exercise independently;Short Term: Able to explain program exercise prescription                Exercise Goals Re-Evaluation :  Exercise Goals Re-Evaluation     Row Name 12/24/21 0733 01/01/22 1457 01/04/22 0807 01/15/22 1416 01/28/22 1153     Exercise Goal Re-Evaluation   Exercise Goals Review Increase Physical Activity;Able to understand and use rate of perceived exertion (RPE) scale;Increase Strength and Stamina;Able to understand and use Dyspnea scale;Knowledge and understanding of Target Heart Rate Range (THRR);Understanding of Exercise Prescription Increase Physical Activity;Increase Strength and Stamina;Understanding of Exercise Prescription Increase Physical Activity;Increase Strength and Stamina;Understanding of Exercise Prescription;Able to understand and use rate of perceived exertion (RPE) scale;Knowledge and understanding of Target Heart Rate Range (THRR);Able to understand and use Dyspnea scale;Able to check pulse independently Increase Physical Activity;Increase Strength and Stamina;Understanding of Exercise Prescription;Able to understand and use rate of perceived exertion (RPE) scale;Knowledge and understanding  of Target Heart Rate Range (THRR);Able to understand and use Dyspnea scale;Able to check pulse independently Increase Physical Activity;Increase Strength and Stamina;Understanding of Exercise Prescription   Comments Reviewed RPE and dyspnea scales, THR and program prescription with pt today.  Pt voiced understanding and was given a copy of goals to take home. Jerrian is off to a good start in rehab. He had an overall average MET level of 3.66 METs. He also increased his workload  on the treadmill to a speed of 3.6 mph and an incline of 3.0%. He has tolerated using 5 lb hand weights as well. We will continue to monitor his progress in the program. Reviewed home exercise with pt today.  Pt plans to walk and run at home for exercise.  Reviewed THR, pulse, RPE, sign and symptoms, pulse oximetery and when to call 911 or MD.  Also discussed weather considerations and indoor options.  Pt voiced understanding. Rod continues to do well in rehab. He recently increased his overall average MET level to 4.69 METs. He also increased his workload on the treadmill to a speed of 6.5 mph and an incline of 3%. He also got up to 45 laps on the track as well. We will continue to monitor his progress. Rod continues to do well in rehab. He has increased to level 7 on the XR and started doing intervals on the treadmill, ranging anywhere between 3.3-4.6 mph and 3-8.5% incline. He tried the elliptical and was able to work on level 1. His handweights for resistance training are now at 6 lbs. We will continue to monitor.   Expected Outcomes Short: Use RPE daily to regulate intensity. Long: Follow program prescription in THR. Short: Continue to increase workloads. Long: Continue to increase strength and stamina. Short: Start to add in more running Long:Continue to improve stamina Short: try 6 lb weights. Long:Continue to improve strength and stamina. Short: Continue working on elliptical, increase workload when appropriate Long: Continue to increase overall MET level    Row Name 02/11/22 0936 02/11/22 1749 02/25/22 1333         Exercise Goal Re-Evaluation   Exercise Goals Review Increase Physical Activity;Increase Strength and Stamina;Understanding of Exercise Prescription Increase Physical Activity;Increase Strength and Stamina;Understanding of Exercise Prescription Increase Physical Activity;Increase Strength and Stamina;Understanding of Exercise Prescription     Comments Rod will be out unitl 12/20 due to  school and work obligations.  He will continue to exercise while out by running and training with his [place] team.  They have their big event coming up. Rod is doing well inrehab. He has been doing intervals on the treadmill and running at 8 mph. He also improved to level 2 on the elliptical with a speed of 5.5 mph. He will be out unitl 12/20 due to school and work obligations.  He will continue to exercise while out by running and training with his [place] team.  They have their big event coming up. Rod will continue to be out until 12/20 due to his school. He will continue to follow up once he returns.     Expected Outcomes Conitnue to exercise while out on leave Short: Conitnue to exercise while out on leave. Long: Continue to increase strength and stamina. Short: Return to rehab Long: Complete [facility] program              Discharge Exercise Prescription (Final Exercise Prescription Changes):  Exercise Prescription Changes - 02/11/22 1700       Response to Exercise  Blood Pressure (Admit) 124/70    Blood Pressure (Exit) 102/62    Heart Rate (Admit) 65 bpm    Heart Rate (Exercise) 151 bpm    Heart Rate (Exit) 69 bpm    Rating of Perceived Exertion (Exercise) 15    Symptoms none    Duration Continue with 30 min of aerobic exercise without signs/symptoms of physical distress.    Intensity THRR unchanged      Progression   Progression Continue to progress workloads to maintain intensity without signs/symptoms of physical distress.    Average METs 4.88      Resistance Training   Training Prescription Yes    Weight 6 lb    Reps 10-15      Interval Training   Interval Training Yes    Equipment Treadmill    Comments intervals of incline 5-8%, also intervals of speed 3.2-8 mph      Treadmill   MPH 3.5   intervals   Grade 5   intervals   Minutes 15    METs 6.09      Elliptical   Level 2    Speed 5.5    Minutes 15      REL-XR   Level 5    Minutes 15    METs 4.3      T5  Nustep   Level 4    Minutes 15    METs 2.9      Home Exercise Plan   Plans to continue exercise at Home (comment)   walking, running   Frequency Add 2 additional days to program exercise sessions.    Initial Home Exercises Provided 01/04/22      Oxygen   Maintain Oxygen Saturation 88% or higher             Nutrition:  Target Goals: Understanding of nutrition guidelines, daily intake of sodium <1535m, cholesterol <2055m calories 30% from fat and 7% or less from saturated fats, daily to have 5 or more servings of fruits and vegetables.  Education: All About Nutrition: -Group instruction provided by verbal, written material, interactive activities, discussions, models, and posters to present general guidelines for heart healthy nutrition including fat, fiber, MyPlate, the role of sodium in heart healthy nutrition, utilization of the nutrition label, and utilization of this knowledge for meal planning. Follow up email sent as well. Written material given at graduation. [facility]  Education need identified 12/20/21  Date 12/26/21  Educator [facility]ruction Review Code 1- Verbalizes Understanding       Biometrics:  Pre Biometrics - 12/20/21 1143       Pre Biometrics   Height _0  (1.778 m)    Weight 185 lb 4.8 oz (84.1 kg)    Waist Circumference 35.5 inches    Hip Circumference 39 inches    Waist to Hip Ratio 0.91 %    BMI (Calculated) 26.59    Single Leg Stand 30 seconds              Nutrition Therapy Plan and Nutrition Goals:  Nutrition Therapy & Goals - 12/26/21 1428       Nutrition Therapy   RD appointment deferred Yes   Pt feels he is doing well with his nutrition and is not interested in meeting with RD at this time. He came to nutrition education 12/26/21. Will continue to follow up.     Personal Nutrition Goals   Nutrition Goal Pt feels  he is doing well with his nutrition and is not  interested in meeting with RD at this time. He came to nutrition education 12/26/21. Will continue to follow up.             Nutrition Assessments:  MEDIFICTS Score Key: ?70 Need to make dietary changes  40-70 Heart Healthy Diet ? 40 Therapeutic Level Cholesterol Diet  Flowsheet Row Cardiac Rehab from 12/20/2021 in [facility] Cardiac and Pulmonary Rehab  Picture Your Plate Total Score on Admission 85      Picture Your Plate Scores: <56 Unhealthy dietary pattern with much room for improvement. 41-50 Dietary pattern unlikely to meet recommendations for good health and room for improvement. 51-60 More healthful dietary pattern, with some room for improvement.  >60 Healthy dietary pattern, although there may be some specific behaviors that could be improved.    Nutrition Goals Re-Evaluation:  Nutrition Goals Re-Evaluation     Row Name 01/14/22 0727 02/11/22 0939           Goals   Nutrition Goal Pt feels he is doing well with his nutrition and is not interested in meeting with RD at this time. Pt feels he is doing well with his nutrition and is not interested in meeting with RD at this time.      Comment -- Continue to focus on heart healthy eating through holiday eating season.  He does not eat a lot of salt or sweets and feels that he has a good grasp on his eating at this point,.      Expected Outcome -- Continue to focus on heart healthy diet               Nutrition Goals Discharge (Final Nutrition Goals Re-Evaluation):  Nutrition Goals Re-Evaluation - 02/11/22 0939       Goals   Nutrition Goal Pt feels he is doing well with his nutrition and is not interested in meeting with RD at this time.    Comment Continue to focus on heart healthy eating through holiday eating season.  He does not eat a lot of salt or sweets and feels that he has a good grasp on his eating at this point,.    Expected Outcome Continue to focus on heart healthy diet              Psychosocial: Target Goals: Acknowledge presence or absence of significant depression and/or stress, maximize coping skills, provide positive support system. Participant is able to verbalize types and ability to use techniques and skills needed for reducing stress and depression.   Education: Stress, Anxiety, and Depression - Group verbal and visual presentation to define topics covered.  Reviews how body is impacted by stress, anxiety, and depression.  Also discusses healthy ways to reduce stress and to treat/manage anxiety and depression.  Written material given at graduation. Flowsheet Row Cardiac Rehab from 01/30/2022 in [facility] Cardiac and Pulmonary Rehab  Date 01/23/22  Educator [facility]  Instruction Review Code 1- [employer] Understanding       Education: Sleep Hygiene -Provides group verbal and written instruction about how sleep can affect your health.  Define sleep hygiene, discuss sleep cycles and impact of sleep habits. Review good sleep hygiene tips.    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 Yes   wife, friends     Barriers   Psychosocial barriers to participate in program There are no identifiable barriers or psychosocial needs.      Screening Interventions   Interventions Encouraged to exercise;To provide support and resources with identified psychosocial needs;Provide feedback about the scores to participant    Expected Outcomes Short Term goal: Utilizing psychosocial counselor, staff and physician to assist with identification of specific Stressors or current issues interfering with healing process. Setting desired goal for each stressor or current issue identified.;Long Term Goal: Stressors  or current issues are controlled or eliminated.;Short Term goal: Identification and review with participant of any Quality of Life or Depression concerns found by scoring the questionnaire.;Long Term goal: The participant improves quality of Life and PHQ9 Scores as seen by post scores and/or verbalization of changes             Quality of Life Scores:   Quality of Life - 12/20/21 1144       Quality of Life   Select Quality of Life      Quality of Life Scores   Health/Function Pre 29.83 %    Socioeconomic Pre 30 %    Psych/Spiritual Pre 30 %    Family Pre 30 %    GLOBAL Pre 29.93 %            Scores of 19 and below usually indicate a poorer quality of life in these areas.  A difference of  2-3 points is a clinically meaningful difference.  A difference of 2-3 points in the total score of the Quality of Life Index has been associated with significant improvement in overall quality of life, self-image, physical symptoms, and general health in studies assessing change in quality of life.  PHQ-9: Review Flowsheet       12/20/2021  Depression screen PHQ 2/9  Decreased Interest 0  Down, Depressed, Hopeless 0  PHQ - 2 Score 0  Altered sleeping 0  Tired, decreased energy 0  Change in appetite 0  Feeling bad or failure about yourself  0  Trouble concentrating 0  Moving slowly or fidgety/restless 0  Suicidal thoughts 0  PHQ-9 Score 0  Difficult doing work/chores Not difficult at all   Interpretation of Total Score  Total Score Depression Severity:  1-4 = Minimal depression, 5-9 = Mild depression, 10-14 = Moderate depression, 15-19 = Moderately severe depression, 20-27 = Severe depression   Psychosocial Evaluation and Intervention:  Psychosocial Evaluation - 12/14/21 1320       Psychosocial Evaluation & Interventions   Interventions Encouraged to exercise with the program and follow exercise prescription    Comments Georgie has no barriers to attending the program. 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 This can be long hours and time at home to keep the class running. He plans to retire in Dec this year. He lives with his wife and she and some friends are his support. He wants to be able to get back to doing all his previous activities. He stated that his doctor told hm he will reach that goal.    Expected Outcomes STG: Saifan will be able to attend all scheduled sessions, he will progress with his exercise. LTG Trei will continue his exercise progression after discharge    Continue Psychosocial Services  Follow up  required by staff             Psychosocial Re-Evaluation:  Psychosocial Re-Evaluation     Row Name 01/14/22 0728 02/11/22 1610           Psychosocial Re-Evaluation   Current issues with Current Stress Concerns Current Stress Concerns      Comments Patient reports no new changes in sleep, stress, or mental health. He report that his job can be stressful and he works long days, but he is active at work. He is planning to retire in December and will then be able to focus more on his exercise routine at home and managing his health. 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   He is excited for his team comeptition as this is what they have worked towards for over the past year.  He is also working on getting set to retire at the end of December.  He is ready to retire but does not want to miss out on any of his final days.      Expected Outcomes Short: Continue to attend cardiac rehab classes consistently and work towards retirement. Long: Continune good mental health habits. 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   He is excited for his team comeptition as this is what they have worked towards for over the past year.  He is also working on getting set to retire at the end of December.  He is ready to retire but does not want to miss out on any of his final days.    Expected Outcomes Conitnue to exercise for mental boost and work toward retirement    Interventions Encouraged to attend Cardiac Rehabilitation for the exercise    Continue Psychosocial Services  Follow up required by staff             Vocational Rehabilitation: Provide vocational rehab assistance to qualifying candidates.   Vocational Rehab Evaluation & Intervention:  Vocational Rehab - 12/20/21 1145       Initial Vocational Rehab Evaluation & Intervention   Assessment shows need for Vocational Rehabilitation No   Already returned to work            Education: Education Goals: Education classes will be provided on a variety of topics geared toward better understanding of heart health and risk factor modification. Participant will state understanding/return demonstration of topics presented as noted by education test scores.  Learning Barriers/Preferences:   General Cardiac Education Topics:  AED/CPR: - Group verbal and written instruction with the use of models to demonstrate the basic use of the AED with the basic ABC's of resuscitation.   Anatomy and Cardiac Procedures: - Group verbal and visual presentation and models provide information about basic cardiac anatomy and function. Reviews the testing methods  done to diagnose heart disease and the outcomes of the test results. Describes the treatment choices: Medical  Management, Angioplasty, or Coronary Bypass Surgery for treating various heart conditions including Myocardial Infarction, Angina, Valve Disease, and Cardiac Arrhythmias.  Written material given at graduation. Flowsheet Row Cardiac Rehab from 01/30/2022 in [facility] Cardiac and Pul[facility] Rehab  Education need identified 12/20/21       Medication Safety: - Group verbal and visual instruction to review commonly prescribed medications for heart and lung disease. Reviews the medication, class of the drug, and side effects. Includes the steps to properly store meds and maintain the prescription regimen.  Written material given at graduation. Flowsheet Row Cardiac Rehab from 01/30/2022 in [facility] Cardiac and Pulmonary Rehab  Date 01/02/22  Educator SB  Instruction Review Code 1- Verbalizes Understanding       Intimacy: - Group verbal instruction through game format to discuss how heart and lung disease can affect sexual intimacy. Written material given at graduation..   Know Your Numbers and Heart Failure: - Group verbal and visual instruction to discuss disease risk factors for cardiac and pulmonary disease and treatment options.  Reviews associated critical values for Overweight/Obesity, Hypertension, Cholesterol, and Diabetes.  Discusses basics of heart failure: signs/symptoms and treatments.  Introduces Heart Failure Zone chart for action plan for heart failure.  Written material given at graduation. Flowsheet Row Cardiac Rehab from 01/30/2022 in [facility] Cardiac and Pulmonary Rehab  Education need identified 12/20/21  Date 01/09/22  Educator SB  Instruction Review Code 1- Verbalizes Understanding       Infection Prevention: - Provides verbal and written material to individual with discussion of infection control including proper hand washing and proper equipment cleaning during exercise session. Flowsheet Row Cardiac Rehab from 01/30/2022 in [facility] Cardiac and Pulmonary Rehab  Date 12/20/21   Educator [facility]  Instruction Review Code 1- Verbalizes Understanding       Falls Prevention: - Provides verbal and written material to individual with discussion of falls prevention and safety. Flowsheet Row Cardiac Rehab from 01/30/2022 in [facility] Cardiac and Pulmonary Rehab  Date 12/14/21  Educator SB  Instruction Review Code 1- Verbalizes Understanding       Other: -Provides group and verbal instruction on various topics (see comments)   Knowledge Questionnaire Score:  Knowledge Questionnaire Score - 12/20/21 1145       Knowledge Questionnaire Score   Pre Score 22/26             Core Components/Risk Factors/Patient Goals at Admission:  Personal Goals and Risk Factors at Admission - 12/20/21 1148       Core Components/Risk Factors/Patient Goals on Admission    Weight Management Yes;Weight Loss    Intervention Weight Management: Develop a combined nutrition and exercise program designed to reach desired caloric intake, while maintaining appropriate intake of nutrient and fiber, sodium and fats, and appropriate energy expenditure required for the weight goal.;Weight Management: Provide education and appropriate resources to help participant work on and attain dietary goals.    Admit Weight 185 lb 4.8 oz (84.1 kg)    Goal Weight: Short Term 180 lb (81.6 kg)    Goal Weight: Long Term 174 lb (78.9 kg)    Expected Outcomes Short Term: Continue to assess and modify interventions until short term weight is achieved;Long Term: Adherence to nutrition and physical activity/exercise program aimed toward attainment of established weight goal;Weight Loss: Understanding of general recommendations for a balanced deficit meal plan, which promotes 1-2 lb weight loss per week  and includes a negative energy balance of [phone] kcal/d;Understanding recommendations for meals to include 15-35% energy as protein, 25-35% energy from fat, 35-60% energy from carbohydrates, less than 268m of dietary  cholesterol, 20-35 gm of total fiber daily;Understanding of distribution of calorie intake throughout the day with the consumption of 4-5 meals/snacks    Hypertension Yes    Intervention Provide education on lifestyle modifcations including regular physical activity/exercise, weight management, moderate sodium restriction and increased consumption of fresh fruit, vegetables, and low fat dairy, alcohol moderation, and smoking cessation.;Monitor prescription use compliance.    Expected Outcomes Short Term: Continued assessment and intervention until BP is < 140/958mHG in hypertensive participants. < 130/8067mG in hypertensive participants with diabetes, heart failure or chronic kidney disease.;Long Term: Maintenance of blood pressure at goal levels.    Lipids Yes    Intervention Provide education and support for participant on nutrition & aerobic/resistive exercise along with prescribed medications to achieve LDL <73m53mDL >40mg28m Expected Outcomes Short Term: Participant states understanding of desired cholesterol values and is compliant with medications prescribed. Participant is following exercise prescription and nutrition guidelines.;Long Term: Cholesterol controlled with medications as prescribed, with individualized exercise RX and with personalized nutrition plan. Value goals: LDL < 73mg,106m > 40 mg.             Education:Diabetes - Individual verbal and written instruction to review signs/symptoms of diabetes, desired ranges of glucose level fasting, after meals and with exercise. Acknowledge that pre and post exercise glucose checks will be done for 3 sessions at entry of program.   Core Components/Risk Factors/Patient Goals Review:   Goals and Risk Factor Review     Row Name 01/14/22 0725 02/11/22 0940           Core Components/Risk Factors/Patient Goals Review   Personal Goals Review Weight Management/Obesity;Hypertension;Lipids Weight Management/Obesity;Hypertension;Lipids       Review Patient reports that he takes all medications as prescribed. His blood pressures have been in acceptable ranges and his weight has been steady. He attends cardiac rehab regularly and continues in an active lifestyle to help maintain weight and control risk factors. Rod is doing well in rehab.  His weight continues to stay steady.  His pressures are doing well.  He will be out until 12/20 due to school and work obligations and upcoming retirement.      Expected Outcomes Short: continue to attend cardiac rehab classes with consistent attendance. Long: monitor and control cardiac risk factors with healthy lifestyle choices. Continue to monitor risk factors while out.               Core Components/Risk Factors/Patient Goals at Discharge (Final Review):   Goals and Risk Factor Review - 02/11/22 0940       Core Components/Risk Factors/Patient Goals Review   Personal Goals Review Weight Management/Obesity;Hypertension;Lipids    Review Rod is doing well in rehab.  His weight continues to stay steady.  His pressures are doing well.  He will be out until 12/20 due to school and work obligations and upcoming retirement.    Expected Outcomes Continue to monitor risk factors while out.             ITP Comments:  ITP Comments     Row Name 12/14/21 1329 12/20/21 1136 12/24/21 0733 01/02/22 0656 01/30/22 0910   ITP Comments Virtual orientation call completed today. he has an appointment on Date: 12/20/2021  for EP eval and gym Orientation.  Documentation of  diagnosis can be found in [facility] Date: 11/01/2021 . Completed 6MWT and gym orientation. Initial ITP created and sent for review to Dr. Emily Filbert, Medical Director. First full day of exercise!  Patient was oriented to gym and equipment including functions, settings, policies, and procedures.  Patient's individual exercise prescription and treatment plan were reviewed.  All starting workloads were established based on the results of the 6  minute walk test done at initial orientation visit.  The plan for exercise progression was also introduced and progression will be customized based on patient's performance and goals. 30 Day review completed. Medical Director ITP review done, changes made as directed, and signed approval by Medical Director.   New to program 30 Day review completed. Medical Director ITP review done, changes made as directed, and signed approval by Medical Director.    Row Name 02/11/22 0936 02/27/22 0743         ITP Comments Rod will be out until 12/20 due to other obligations with school and wrapping up to retirement. 30 Day review completed. Medical Director ITP review done, changes made as directed, and signed approval by Medical Director.               Comments:

## 2022-03-22 ENCOUNTER — Encounter: Payer: BC Managed Care – PPO | Attending: Internal Medicine | Admitting: *Deleted

## 2022-03-22 DIAGNOSIS — Z48812 Encounter for surgical aftercare following surgery on the circulatory system: Secondary | ICD-10-CM | POA: Insufficient documentation

## 2022-03-22 DIAGNOSIS — Z955 Presence of coronary angioplasty implant and graft: Secondary | ICD-10-CM

## 2022-03-22 NOTE — Progress Notes (Signed)
Daily Session Note  Patient Details  Name: Jose Mckenzie MRN: [id number] Date of Birth: 01-14-1956 Referring Provider:   Flowsheet Row Cardiac Rehab from 12/20/2021 in [facility] Cardiac and Pulmonary Rehab  Referring Provider [name] MD       Encounter Date: 03/22/2022  Check In:  Session Check In - 03/22/22 0836       Check-In   Supervising physician immediately available to respond to emergencies See telemetry face sheet for immediately available ER MD    Location [facility]-Cardiac & Pulmonary Rehab    Staff Present [name], RN, BSN, CCRP;Jessica Rayland, MA, RCEP, CCRP, CCET;Joseph [place], [place]    Virtual Visit No    Medication changes reported     No    Fall or balance concerns reported    No    Warm-up and Cool-down Performed on first and last piece of equipment    Resistance Training Performed Yes    VAD Patient? No    PAD/SET Patient? No      Pain Assessment   Currently in Pain? No/denies                Social History   Tobacco Use  Smoking Status Never  Smokeless Tobacco Never    Goals Met:  Independence with exercise equipment Exercise tolerated well No report of concerns or symptoms today  Goals Unmet:  Not Applicable  Comments: Pt able to follow exercise prescription today without complaint.  Will continue to monitor for progression.    Dr. Emily Filbert is Medical Director for [place].  Dr. Ottie Glazier is Medical Director for [facility] Pulmonary Rehabilitation.

## 2022-03-27 ENCOUNTER — Encounter: Payer: Self-pay | Admitting: *Deleted

## 2022-03-27 ENCOUNTER — Encounter: Payer: BC Managed Care – PPO | Admitting: *Deleted

## 2022-03-27 DIAGNOSIS — Z955 Presence of coronary angioplasty implant and graft: Secondary | ICD-10-CM

## 2022-03-27 NOTE — Progress Notes (Signed)
Daily Session Note  Patient Details  Name: Jose Mckenzie MRN: [id number] Date of Birth: Jan 07, 1956 Referring Provider:   Flowsheet Row Cardiac Rehab from 12/20/2021 in [facility] Cardiac and Pulmonary Rehab  Referring Provider [name] MD       Encounter Date: 03/27/2022  Check In:  Session Check In - 03/27/22 0738       Check-In   Supervising physician immediately available to respond to emergencies See telemetry face sheet for immediately available ER MD    Location [facility]-Cardiac & Pulmonary Rehab    Staff Present Darlyne Russian, RN, ADN;Jessica Luan Pulling, MA, RCEP, CCRP, CCET;Noah Tickle, BS, Exercise Physiologist    Virtual Visit No    Medication changes reported     No    Fall or balance concerns reported    No    Warm-up and Cool-down Performed on first and last piece of equipment    Resistance Training Performed Yes    VAD Patient? No    PAD/SET Patient? No      Pain Assessment   Currently in Pain? No/denies                Social History   Tobacco Use  Smoking Status Never  Smokeless Tobacco Never    Goals Met:  Independence with exercise equipment Exercise tolerated well No report of concerns or symptoms today Strength training completed today  Goals Unmet:  Not Applicable  Comments: Pt able to follow exercise prescription today without complaint.  Will continue to monitor for progression.    Dr. Emily Filbert is Medical Director for [place].  Dr. Ottie Glazier is Medical Director for [facility] Pulmonary Rehabilitation.

## 2022-03-27 NOTE — Progress Notes (Signed)
Cardiac Individual Treatment Plan  Patient Details  Name: Jose Mckenzie MRN: [id number] Date of Birth: [birth year] Referring Provider:   Flowsheet Row Cardiac Rehab from 12/20/2021 in [facility] Cardiac and Pulmonary Rehab  Referring Provider [name] MD       Initial Encounter Date:  Flowsheet Row Cardiac Rehab from 12/20/2021 in [facility] Cardiac and Pulmonary Rehab  Date 12/20/21       Visit Diagnosis: Status post coronary artery stent placement  Patient's Home Medications on Admission:  Current Outpatient Medications:    aspirin EC 81 MG tablet, Take 1 tablet (81 mg total) by mouth daily. Swallow whole., Disp: 90 tablet, Rfl: 3   losartan (COZAAR) 50 MG tablet, Take 1 tablet (50 mg total) by mouth daily., Disp: 30 tablet, Rfl: 11   metoprolol succinate (TOPROL XL) 25 MG 24 hr tablet, Take 1 tablet (25 mg total) by mouth daily., Disp: 30 tablet, Rfl: 11   rosuvastatin (CRESTOR) 20 MG tablet, Take 2 tablets (40 mg total) by mouth daily., Disp: 30 tablet, Rfl: 11   spironolactone (ALDACTONE) 25 MG tablet, Take 12.5 mg by mouth daily., Disp: , Rfl:    ticagrelor (BRILINTA) 90 MG TABS tablet, Take 1 tablet (90 mg total) by mouth 2 (two) times daily., Disp: 60 tablet, Rfl: 0  Past Medical History: Past Medical History:  Diagnosis Date   Hx of dysplastic nevus 2015   multiple sites    Tobacco Use: Social History   Tobacco Use  Smoking Status Never  Smokeless Tobacco Never    Labs: Review Flowsheet        No data to display           Exercise Target Goals: Exercise Program Goal: Individual exercise prescription set using results from initial 6 min walk test and THRR while considering  patient's activity barriers and safety.   Exercise Prescription Goal: Initial exercise prescription builds to 30-45 minutes a day of aerobic activity, 2-3 days per week.  Home exercise guidelines will be given to patient during program as part of exercise prescription that the  participant will acknowledge.   Education: Aerobic Exercise: - Group verbal and visual presentation on the components of exercise prescription. Introduces F.I.T.T principle from ACSM for exercise prescriptions.  Reviews F.I.T.T. principles of aerobic exercise including progression. Written material given at graduation.   Education: Resistance Exercise: - Group verbal and visual presentation on the components of exercise prescription. Introduces F.I.T.T principle from ACSM for exercise prescriptions  Reviews F.I.T.T. principles of resistance exercise including progression. Written material given at graduation.    Education: Exercise & Equipment Safety: - Individual verbal instruction and demonstration of equipment use and safety with use of the equipment. Flowsheet Row Cardiac Rehab from 03/27/2022 in [facility] Cardiac and Pulmonary Rehab  Date 12/20/21  Educator [facility]  Instruction Review Code 1- Verbalizes Understanding       Education: Exercise Physiology & General Exercise Guidelines: - Group verbal and written instruction with models to review the exercise physiology of the cardiovascular system and associated critical values. Provides general exercise guidelines with specific guidelines to those with heart or lung disease.  Flowsheet Row Cardiac Rehab from 03/27/2022 in [facility] Cardiac and Pulmonary Rehab  Date 01/30/22  Educator [facility]  Instruction Review Code 1- Verbalizes Understanding       Education: Flexibility, Balance, Mind/Body Relaxation: - Group verbal and visual presentation with interactive activity on the components of exercise prescription. Introduces F.I.T.T principle from ACSM for exercise prescriptions. Reviews F.I.T.T. principles of flexibility and  balance exercise training including progression. Also discusses the mind body connection.  Reviews various relaxation techniques to help reduce and manage stress (i.e. Deep breathing, progressive muscle relaxation, and  visualization). Balance handout provided to take home. Written material given at graduation.   Activity Barriers & Risk Stratification:  Activity Barriers & Cardiac Risk Stratification - 12/20/21 1138       Activity Barriers & Cardiac Risk Stratification   Activity Barriers Back Problems;Deconditioning    Cardiac Risk Stratification Moderate             6 Minute Walk:  6 Minute Walk     Row Name 12/20/21 1137         6 Minute Walk   Phase Initial     Distance 1675 feet     Walk Time 6 minutes     # of Rest Breaks 0     MPH 3.17     METS 3.69     RPE 7     VO2 Peak 12.87     Symptoms No     Resting HR 57 bpm     Resting BP 128/64     Resting Oxygen Saturation  98 %     Exercise Oxygen Saturation  during 6 min walk 97 %     Max Ex. HR 93 bpm     Max Ex. BP 128/74     2 Minute Post BP 112/62              Oxygen Initial Assessment:   Oxygen Re-Evaluation:   Oxygen Discharge (Final Oxygen Re-Evaluation):   Initial Exercise Prescription:  Initial Exercise Prescription - 12/20/21 1100       Date of Initial Exercise RX and Referring Provider   Date 12/20/21    Referring Provider [name] MD      Oxygen   Maintain Oxygen Saturation 88% or higher      Treadmill   MPH 3.3    Grade 1    Minutes 15    METs 3.98      REL-XR   Level 3    Speed 50    Minutes 15    METs 3      T5 Nustep   Level 3    SPM 80    Minutes 15    METs 3      Track   Laps 42    Minutes 15    METs 3.28      Prescription Details   Frequency (times per week) 3    Duration Progress to 30 minutes of continuous aerobic without signs/symptoms of physical distress      Intensity   THRR 40-80% of Max Heartrate 95-133    Ratings of Perceived Exertion 11-13    Perceived Dyspnea 0-4      Progression   Progression Continue to progress workloads to maintain intensity without signs/symptoms of physical distress.      Resistance Training   Training Prescription  Yes    Weight 5 lb    Reps 10-15             Perform Capillary Blood Glucose checks as needed.  Exercise Prescription Changes:   Exercise Prescription Changes     Row Name 12/20/21 1100 01/01/22 1400 01/04/22 0800 01/15/22 1400 01/28/22 1100     Response to Exercise   Blood Pressure (Admit) 128/64 122/62 -- 118/72 102/60   Blood Pressure (Exercise) 128/74 142/80 -- 160/80 --   Blood Pressure (Exit)  112/64 106/62 -- 112/68 120/60   Heart Rate (Admit) 57 bpm 55 bpm -- 51 bpm 59 bpm   Heart Rate (Exercise) 93 bpm 111 bpm -- 133 bpm 132 bpm   Heart Rate (Exit) 68 bpm 76 bpm -- 68 bpm 65 bpm   Oxygen Saturation (Admit) 98 % -- -- -- --   Oxygen Saturation (Exercise) 97 % -- -- -- --   Rating of Perceived Exertion (Exercise) 7 13 -- 13 13   Symptoms none none -- none none   Comments walk test results First three days of exercise -- -- --   Duration -- Continue with 30 min of aerobic exercise without signs/symptoms of physical distress. -- Continue with 30 min of aerobic exercise without signs/symptoms of physical distress. Continue with 30 min of aerobic exercise without signs/symptoms of physical distress.   Intensity -- THRR unchanged -- THRR unchanged THRR unchanged     Progression   Progression -- Continue to progress workloads to maintain intensity without signs/symptoms of physical distress. -- Continue to progress workloads to maintain intensity without signs/symptoms of physical distress. Continue to progress workloads to maintain intensity without signs/symptoms of physical distress.   Average METs -- 3.66 -- 4.69 6.16     Resistance Training   Training Prescription -- Yes -- Yes Yes   Weight -- 5 lb -- 5 lb 6 lb   Reps -- 10-15 -- 10-15 10-15     Interval Training   Interval Training -- No -- No Yes   Equipment -- -- -- -- Treadmill   Comments -- -- -- -- 4-8.5% incline     Treadmill   MPH -- 3.6 -- 6.5 4.6  intervals   Grade -- 3 -- 3 7.5   Minutes -- 15 -- 15  15   METs -- 5.25 -- 8.67 11.2     Elliptical   Level -- -- -- -- 1   Speed -- -- -- -- 2.5   Minutes -- -- -- -- 15     REL-XR   Level -- 3 -- 3 7   Minutes -- 15 -- 15 15   METs -- 4.2 -- 5.7 7.4     T5 Nustep   Level -- 3 -- 5 4   Minutes -- 15 -- 15 15   METs -- 2.6 -- 3.8 3.2     Track   Laps -- 36 -- 45 --   Minutes -- 15 -- 15 --   METs -- 2.96 -- 3.45 --     Home Exercise Plan   Plans to continue exercise at -- -- Home (comment)  walking, running Home (comment)  walking, running Home (comment)  walking, running   Frequency -- -- Add 2 additional days to program exercise sessions. Add 2 additional days to program exercise sessions. Add 2 additional days to program exercise sessions.   Initial Home Exercises Provided -- -- 01/04/22 01/04/22 01/04/22     Oxygen   Maintain Oxygen Saturation -- 88% or higher -- 88% or higher 88% or higher    Row Name 02/11/22 1700             Response to Exercise   Blood Pressure (Admit) 124/70       Blood Pressure (Exit) 102/62       Heart Rate (Admit) 65 bpm       Heart Rate (Exercise) 151 bpm       Heart Rate (Exit) 69 bpm  Rating of Perceived Exertion (Exercise) 15       Symptoms none       Duration Continue with 30 min of aerobic exercise without signs/symptoms of physical distress.       Intensity THRR unchanged         Progression   Progression Continue to progress workloads to maintain intensity without signs/symptoms of physical distress.       Average METs 4.88         Resistance Training   Training Prescription Yes       Weight 6 lb       Reps 10-15         Interval Training   Interval Training Yes       Equipment Treadmill       Comments intervals of incline 5-8%, also intervals of speed 3.2-8 mph         Treadmill   MPH 3.5  intervals       Grade 5  intervals       Minutes 15       METs 6.09         Elliptical   Level 2       Speed 5.5       Minutes 15         REL-XR   Level 5        Minutes 15       METs 4.3         T5 Nustep   Level 4       Minutes 15       METs 2.9         Home Exercise Plan   Plans to continue exercise at Home (comment)  walking, running       Frequency Add 2 additional days to program exercise sessions.       Initial Home Exercises Provided 01/04/22         Oxygen   Maintain Oxygen Saturation 88% or higher                Exercise Comments:   Exercise Comments     Row Name 12/24/21 0733           Exercise Comments First full day of exercise!  Patient was oriented to gym and equipment including functions, settings, policies, and procedures.  Patient's individual exercise prescription and treatment plan were reviewed.  All starting workloads were established based on the results of the 6 minute walk test done at initial orientation visit.  The plan for exercise progression was also introduced and progression will be customized based on patient's performance and goals.                Exercise Goals and Review:   Exercise Goals     Row Name 12/20/21 1143             Exercise Goals   Increase Physical Activity Yes       Intervention Provide advice, education, support and counseling about physical activity/exercise needs.;Develop an individualized exercise prescription for aerobic and resistive training based on initial evaluation findings, risk stratification, comorbidities and participant's personal goals.       Expected Outcomes Short Term: Attend rehab on a regular basis to increase amount of physical activity.;Long Term: Add in home exercise to make exercise part of routine and to increase amount of physical activity.;Long Term: Exercising regularly at least 3-5 days a week.       Increase Strength  and Stamina Yes       Intervention Provide advice, education, support and counseling about physical activity/exercise needs.;Develop an individualized exercise prescription for aerobic and resistive training based on initial  evaluation findings, risk stratification, comorbidities and participant's personal goals.       Expected Outcomes Short Term: Increase workloads from initial exercise prescription for resistance, speed, and METs.;Short Term: Perform resistance training exercises routinely during rehab and add in resistance training at home;Long Term: Improve cardiorespiratory fitness, muscular endurance and strength as measured by increased METs and functional capacity (6MWT)       Able to understand and use rate of perceived exertion (RPE) scale Yes       Intervention Provide education and explanation on how to use RPE scale       Expected Outcomes Short Term: Able to use RPE daily in rehab to express subjective intensity level;Long Term:  Able to use RPE to guide intensity level when exercising independently       Able to understand and use Dyspnea scale Yes       Intervention Provide education and explanation on how to use Dyspnea scale       Expected Outcomes Short Term: Able to use Dyspnea scale daily in rehab to express subjective sense of shortness of breath during exertion;Long Term: Able to use Dyspnea scale to guide intensity level when exercising independently       Knowledge and understanding of Target Heart Rate Range (THRR) Yes       Intervention Provide education and explanation of THRR including how the numbers were predicted and where they are located for reference       Expected Outcomes Short Term: Able to state/look up THRR;Short Term: Able to use daily as guideline for intensity in rehab;Long Term: Able to use THRR to govern intensity when exercising independently       Able to check pulse independently Yes       Intervention Provide education and demonstration on how to check pulse in carotid and radial arteries.;Review the importance of being able to check your own pulse for safety during independent exercise       Expected Outcomes Long Term: Able to check pulse independently and accurately;Short  Term: Able to explain why pulse checking is important during independent exercise       Understanding of Exercise Prescription Yes       Intervention Provide education, explanation, and written materials on patient's individual exercise prescription       Expected Outcomes Long Term: Able to explain home exercise prescription to exercise independently;Short Term: Able to explain program exercise prescription                Exercise Goals Re-Evaluation :  Exercise Goals Re-Evaluation     Row Name 12/24/21 0733 01/01/22 1457 01/04/22 0807 01/15/22 1416 01/28/22 1153     Exercise Goal Re-Evaluation   Exercise Goals Review Increase Physical Activity;Able to understand and use rate of perceived exertion (RPE) scale;Increase Strength and Stamina;Able to understand and use Dyspnea scale;Knowledge and understanding of Target Heart Rate Range (THRR);Understanding of Exercise Prescription Increase Physical Activity;Increase Strength and Stamina;Understanding of Exercise Prescription Increase Physical Activity;Increase Strength and Stamina;Understanding of Exercise Prescription;Able to understand and use rate of perceived exertion (RPE) scale;Knowledge and understanding of Target Heart Rate Range (THRR);Able to understand and use Dyspnea scale;Able to check pulse independently Increase Physical Activity;Increase Strength and Stamina;Understanding of Exercise Prescription;Able to understand and use rate of perceived exertion (RPE) scale;Knowledge and understanding  of Target Heart Rate Range (THRR);Able to understand and use Dyspnea scale;Able to check pulse independently Increase Physical Activity;Increase Strength and Stamina;Understanding of Exercise Prescription   Comments Reviewed RPE and dyspnea scales, THR and program prescription with pt today.  Pt voiced understanding and was given a copy of goals to take home. Escher is off to a good start in rehab. He had an overall average MET level of 3.66 METs. He  also increased his workload on the treadmill to a speed of 3.6 mph and an incline of 3.0%. He has tolerated using 5 lb hand weights as well. We will continue to monitor his progress in the program. Reviewed home exercise with pt today.  Pt plans to walk and run at home for exercise.  Reviewed THR, pulse, RPE, sign and symptoms, pulse oximetery and when to call 911 or MD.  Also discussed weather considerations and indoor options.  Pt voiced understanding. Rod continues to do well in rehab. He recently increased his overall average MET level to 4.69 METs. He also increased his workload on the treadmill to a speed of 6.5 mph and an incline of 3%. He also got up to 45 laps on the track as well. We will continue to monitor his progress. Rod continues to do well in rehab. He has increased to level 7 on the XR and started doing intervals on the treadmill, ranging anywhere between 3.3-4.6 mph and 3-8.5% incline. He tried the elliptical and was able to work on level 1. His handweights for resistance training are now at 6 lbs. We will continue to monitor.   Expected Outcomes Short: Use RPE daily to regulate intensity. Long: Follow program prescription in THR. Short: Continue to increase workloads. Long: Continue to increase strength and stamina. Short: Start to add in more running Long:Continue to improve stamina Short: try 6 lb weights. Long:Continue to improve strength and stamina. Short: Continue working on elliptical, increase workload when appropriate Long: Continue to increase overall MET level    Row Name 02/11/22 0936 02/11/22 1749 02/25/22 1333 03/22/22 0756       Exercise Goal Re-Evaluation   Exercise Goals Review Increase Physical Activity;Increase Strength and Stamina;Understanding of Exercise Prescription Increase Physical Activity;Increase Strength and Stamina;Understanding of Exercise Prescription Increase Physical Activity;Increase Strength and Stamina;Understanding of Exercise Prescription Increase  Physical Activity;Increase Strength and Stamina;Understanding of Exercise Prescription    Comments Rod will be out unitl 12/20 due to school and work obligations.  He will continue to exercise while out by running and training with his [employer] team.  They have their big event coming up. Rod is doing well inrehab. He has been doing intervals on the treadmill and running at 8 mph. He also improved to level 2 on the elliptical with a speed of 5.5 mph. He will be out unitl 12/20 due to school and work obligations.  He will continue to exercise while out by running and training with his [place] team.  They have their big event coming up. Rod will continue to be out until 12/20 due to his school. He will continue to follow up once he returns. Rod returned to rehab today.  He is now using a knee brace on his left knee.  A couple weeks ago he started limping and struggling with pain.  He went to doctor and was put on predisone taper.  It has helped and he is walking normal again.   If it continues to act up, he will got for a cortisone injection.  He is slowly building back up to his walking full time again.    Expected Outcomes Conitnue to exercise while out on leave Short: Conitnue to exercise while out on leave. Long: Continue to increase strength and stamina. Short: Return to rehab Long: Complete [facility] program Short: return to regular routine again Long: Continue to build stamina             Discharge Exercise Prescription (Final Exercise Prescription Changes):  Exercise Prescription Changes - 02/11/22 1700       Response to Exercise   Blood Pressure (Admit) 124/70    Blood Pressure (Exit) 102/62    Heart Rate (Admit) 65 bpm    Heart Rate (Exercise) 151 bpm    Heart Rate (Exit) 69 bpm    Rating of Perceived Exertion (Exercise) 15    Symptoms none    Duration Continue with 30 min of aerobic exercise without signs/symptoms of physical distress.    Intensity THRR unchanged      Progression    Progression Continue to progress workloads to maintain intensity without signs/symptoms of physical distress.    Average METs 4.88      Resistance Training   Training Prescription Yes    Weight 6 lb    Reps 10-15      Interval Training   Interval Training Yes    Equipment Treadmill    Comments intervals of incline 5-8%, also intervals of speed 3.2-8 mph      Treadmill   MPH 3.5   intervals   Grade 5   intervals   Minutes 15    METs 6.09      Elliptical   Level 2    Speed 5.5    Minutes 15      REL-XR   Level 5    Minutes 15    METs 4.3      T5 Nustep   Level 4    Minutes 15    METs 2.9      Home Exercise Plan   Plans to continue exercise at Home (comment)   walking, running   Frequency Add 2 additional days to program exercise sessions.    Initial Home Exercises Provided 01/04/22      Oxygen   Maintain Oxygen Saturation 88% or higher             Nutrition:  Target Goals: Understanding of nutrition guidelines, daily intake of sodium <1546m, cholesterol <2087m calories 30% from fat and 7% or less from saturated fats, daily to have 5 or more servings of fruits and vegetables.  Education: All About Nutrition: -Group instruction provided by verbal, written material, interactive activities, discussions, models, and posters to present general guidelines for heart healthy nutrition including fat, fiber, MyPlate, the role of sodium in heart healthy nutrition, utilization of the nutrition label, and utilization of this knowledge for meal planning. Follow up email sent as well. Written material given at graduation. [facility]  Education need identified 12/20/21  Date 12/26/21  Educator [place]ruction Review Code 1- Verbalizes Understanding       Biometrics:  Pre Biometrics - 12/20/21 1143       Pre Biometrics   Height _0  (1.778 m)    Weight 185 lb 4.8 oz (84.1 kg)    Waist  Circumference 35.5 inches    Hip Circumference 39 inches    Waist to Hip Ratio 0.91 %    BMI (Calculated)  26.59    Single Leg Stand 30 seconds              Nutrition Therapy Plan and Nutrition Goals:  Nutrition Therapy & Goals - 12/26/21 1428       Nutrition Therapy   RD appointment deferred Yes   Pt feels he is doing well with his nutrition and is not interested in meeting with RD at this time. He came to nutrition education 12/26/21. Will continue to follow up.     Personal Nutrition Goals   Nutrition Goal Pt feels he is doing well with his nutrition and is not interested in meeting with RD at this time. He came to nutrition education 12/26/21. Will continue to follow up.             Nutrition Assessments:  MEDIFICTS Score Key: ?70 Need to make dietary changes  40-70 Heart Healthy Diet ? 40 Therapeutic Level Cholesterol Diet  Flowsheet Row Cardiac Rehab from 12/20/2021 in [facility] Cardiac and Pulmonary Rehab  Picture Your Plate Total Score on Admission 85      Picture Your Plate Scores: <78 Unhealthy dietary pattern with much room for improvement. 41-50 Dietary pattern unlikely to meet recommendations for good health and room for improvement. 51-60 More healthful dietary pattern, with some room for improvement.  >60 Healthy dietary pattern, although there may be some specific behaviors that could be improved.    Nutrition Goals Re-Evaluation:  Nutrition Goals Re-Evaluation     Row Name 01/14/22 0727 02/11/22 0939 03/22/22 0801         Goals   Nutrition Goal Pt feels he is doing well with his nutrition and is not interested in meeting with RD at this time. Pt feels he is doing well with his nutrition and is not interested in meeting with RD at this time. Pt feels he is doing well with his nutrition and is not interested in meeting with RD at this time.     Comment -- Continue to focus on heart healthy eating through holiday eating season.  He does not eat a lot of  salt or sweets and feels that he has a good grasp on his eating at this point,. Rod has been doing well in rehab.  He was sticking to his diet other than the Christmas treats his kids have been bringing in for him.  He is also going to out to eat to celebrate his retirement.  Otherwise, he is doing well with his diet.     Expected Outcome -- Continue to focus on heart healthy diet Continue to focus on heart healthy diet              Nutrition Goals Discharge (Final Nutrition Goals Re-Evaluation):  Nutrition Goals Re-Evaluation - 03/22/22 0801       Goals   Nutrition Goal Pt feels he is doing well with his nutrition and is not interested in meeting with RD at this time.    Comment Rod has been doing well in rehab.  He was sticking to his diet other than the Christmas treats his kids have been bringing in for him.  He is also going to out to eat to celebrate his retirement.  Otherwise, he is doing well with his diet.    Expected Outcome Continue to focus on heart healthy diet             Psychosocial: Target Goals: Acknowledge presence or absence of significant depression and/or stress, maximize coping skills, provide positive  support system. Participant is able to verbalize types and ability to use techniques and skills needed for reducing stress and depression.   Education: Stress, Anxiety, and Depression - Group verbal and visual presentation to define topics covered.  Reviews how body is impacted by stress, anxiety, and depression.  Also discusses healthy ways to reduce stress and to treat/manage anxiety and depression.  Written material given at graduation. Flowsheet Row Cardiac Rehab from 03/27/2022 in [facility] Cardiac and Pulmonary Rehab  Date 01/23/22  Educator [facility]  Instruction Review Code 1- [employer] Understanding       Education: Sleep Hygiene -Provides group verbal and written instruction about how sleep can affect your health.  Define sleep hygiene, discuss sleep cycles  and impact of sleep habits. Review good sleep hygiene tips.    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 Yes   wife, friends     Barriers   Psychosocial barriers to participate in program There are no identifiable barriers or psychosocial needs.      Screening Interventions   Interventions Encouraged to exercise;To provide support and resources with identified psychosocial needs;Provide feedback about the scores to participant    Expected Outcomes Short Term goal: Utilizing psychosocial counselor, staff and physician to assist with identification of specific Stressors or current issues interfering with healing process. Setting desired goal for each stressor or current issue identified.;Long Term Goal: Stressors or current issues are controlled or eliminated.;Short Term goal: Identification and review with participant of any Quality of Life or Depression concerns found by scoring the questionnaire.;Long Term goal: The participant improves quality of Life and PHQ9 Scores as seen by post scores and/or verbalization of changes             Quality of Life Scores:   Quality of Life - 12/20/21 1144       Quality of Life   Select Quality of Life      Quality of Life Scores   Health/Function Pre 29.83 %    Socioeconomic Pre 30 %    Psych/Spiritual Pre 30 %    Family Pre 30 %    GLOBAL Pre 29.93 %            Scores of 19 and below usually indicate a poorer quality of life in these areas.  A difference of  2-3 points is a clinically meaningful difference.  A difference of 2-3 points in the total score of the Quality of Life Index has been associated with significant improvement in overall quality of life,  self-image, physical symptoms, and general health in studies assessing change in quality of life.  PHQ-9: Review Flowsheet       12/20/2021  Depression screen PHQ 2/9  Decreased Interest 0  Down, Depressed, Hopeless 0  PHQ - 2 Score 0  Altered sleeping 0  Tired, decreased energy 0  Change in appetite 0  Feeling bad or failure about yourself  0  Trouble concentrating 0  Moving slowly or fidgety/restless 0  Suicidal thoughts 0  PHQ-9 Score 0  Difficult doing work/chores Not difficult at all   Interpretation of Total Score  Total Score Depression Severity:  1-4 =  Minimal depression, 5-9 = Mild depression, 10-14 = Moderate depression, 15-19 = Moderately severe depression, 20-27 = Severe depression   Psychosocial Evaluation and Intervention:  Psychosocial Evaluation - 12/14/21 1320       Psychosocial Evaluation & Interventions   Interventions Encouraged to exercise with the program and follow exercise prescription    Comments Percival has no barriers to attending the program. 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 This can be long hours and time at home to keep the class running. He plans to retire in Dec this year. He lives with his wife and she and some friends are his support. He wants to be able to get back to doing all his previous activities. He stated that his doctor told hm he will reach that goal.    Expected Outcomes STG: Beniah will be able to attend all scheduled sessions, he will progress with his exercise. LTG Elvan will continue his exercise progression after discharge    Continue Psychosocial Services  Follow up required by staff             Psychosocial Re-Evaluation:  Psychosocial Re-Evaluation     [place] Name 01/14/22 0728 02/11/22 0937 03/22/22 0759         Psychosocial Re-Evaluation   Current issues with Current Stress Concerns Current Stress Concerns Current Stress Concerns     Comments Patient reports no new changes in sleep,  stress, or mental health. He report that his job can be stressful and he works long days, but he is active at work. He is planning to retire in December and will then be able to focus more on his exercise routine at home and managing his health. 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   He is excited for his team comeptition as this is what they have worked towards for over the past year.  He is also working on getting set to retire at the end of December.  He is ready to retire but does not want to miss out on any of his final days. Rod returned today after being out since 11/13.  He is set to officially retire next Friday!!  His kids decorated his room yesterday at school to celebrate!  He has enjoyed this last days with his kids.  He has been happy and healthy for most part.  He is sleeping well and doing well mentally.     Expected Outcomes Short: Continue to attend cardiac rehab classes consistently and work towards retirement. Long: Continune good mental health habits. Conitnue to exercise for mental boost and work toward retirement Enjoy retirement!!     Interventions -- Encouraged to attend Cardiac Rehabilitation for the exercise Encouraged to attend Cardiac Rehabilitation for the exercise     Continue Psychosocial Services  Follow up required by staff Follow up required by staff Follow up required by staff              Psychosocial Discharge (Final Psychosocial Re-Evaluation):  Psychosocial Re-Evaluation - 03/22/22 0759       Psychosocial Re-Evaluation   Current issues with Current Stress Concerns    Comments Rod returned today after being out since 11/13.  He is set to officially retire next Friday!!  His kids decorated his room yesterday at school to celebrate!  He has enjoyed this last days with his kids.  He has  been happy and healthy for most part.  He is sleeping well and doing well mentally.    Expected  Outcomes Enjoy retirement!!    Interventions Encouraged to attend Cardiac Rehabilitation for the exercise    Continue Psychosocial Services  Follow up required by staff             Vocational Rehabilitation: Provide vocational rehab assistance to qualifying candidates.   Vocational Rehab Evaluation & Intervention:  Vocational Rehab - 12/20/21 1145       Initial Vocational Rehab Evaluation & Intervention   Assessment shows need for Vocational Rehabilitation No   Already returned to work            Education: Education Goals: Education classes will be provided on a variety of topics geared toward better understanding of heart health and risk factor modification. Participant will state understanding/return demonstration of topics presented as noted by education test scores.  Learning Barriers/Preferences:   General Cardiac Education Topics:  AED/CPR: - Group verbal and written instruction with the use of models to demonstrate the basic use of the AED with the basic ABC's of resuscitation.   Anatomy and Cardiac Procedures: - Group verbal and visual presentation and models provide information about basic cardiac anatomy and function. Reviews the testing methods done to diagnose heart disease and the outcomes of the test results. Describes the treatment choices: Medical Management, Angioplasty, or Coronary Bypass Surgery for treating various heart conditions including Myocardial Infarction, Angina, Valve Disease, and Cardiac Arrhythmias.  Written material given at graduation. Flowsheet Row Cardiac Rehab from 03/27/2022 in [facility] Cardiac and Pul[facility] Rehab  Education need identified 12/20/21       Medication Safety: - Group verbal and visual instruction to review commonly prescribed medications for heart and lung disease. Reviews the medication, class of the drug, and side effects. Includes the steps to properly store meds and maintain the prescription regimen.  Written  material given at graduation. Flowsheet Row Cardiac Rehab from 03/27/2022 in [facility] Cardiac and Pulmonary Rehab  Date 01/02/22  Educator SB  Instruction Review Code 1- Verbalizes Understanding       Intimacy: - Group verbal instruction through game format to discuss how heart and lung disease can affect sexual intimacy. Written material given at graduation..   Know Your Numbers and Heart Failure: - Group verbal and visual instruction to discuss disease risk factors for cardiac and pulmonary disease and treatment options.  Reviews associated critical values for Overweight/Obesity, Hypertension, Cholesterol, and Diabetes.  Discusses basics of heart failure: signs/symptoms and treatments.  Introduces Heart Failure Zone chart for action plan for heart failure.  Written material given at graduation. Flowsheet Row Cardiac Rehab from 03/27/2022 in [facility] Cardiac and Pulmonary Rehab  Education need identified 12/20/21  Date 01/09/22  Educator SB  Instruction Review Code 1- Verbalizes Understanding       Infection Prevention: - Provides verbal and written material to individual with discussion of infection control including proper hand washing and proper equipment cleaning during exercise session. Flowsheet Row Cardiac Rehab from 03/27/2022 in [facility] Cardiac and Pulmonary Rehab  Date 12/20/21  Educator [facility]  Instruction Review Code 1- Verbalizes Understanding       Falls Prevention: - Provides verbal and written material to individual with discussion of falls prevention and safety. Flowsheet Row Cardiac Rehab from 03/27/2022 in [facility] Cardiac and Pulmonary Rehab  Date 12/14/21  Educator SB  Instruction Review Code 1- Verbalizes Understanding       Other: -Provides group and verbal instruction  on various topics (see comments)   Knowledge Questionnaire Score:  Knowledge Questionnaire Score - 12/20/21 1145       Knowledge Questionnaire Score   Pre Score 22/26             Core  Components/Risk Factors/Patient Goals at Admission:  Personal Goals and Risk Factors at Admission - 12/20/21 1148       Core Components/Risk Factors/Patient Goals on Admission    Weight Management Yes;Weight Loss    Intervention Weight Management: Develop a combined nutrition and exercise program designed to reach desired caloric intake, while maintaining appropriate intake of nutrient and fiber, sodium and fats, and appropriate energy expenditure required for the weight goal.;Weight Management: Provide education and appropriate resources to help participant work on and attain dietary goals.    Admit Weight 185 lb 4.8 oz (84.1 kg)    Goal Weight: Short Term 180 lb (81.6 kg)    Goal Weight: Long Term 174 lb (78.9 kg)    Expected Outcomes Short Term: Continue to assess and modify interventions until short term weight is achieved;Long Term: Adherence to nutrition and physical activity/exercise program aimed toward attainment of established weight goal;Weight Loss: Understanding of general recommendations for a balanced deficit meal plan, which promotes 1-2 lb weight loss per week and includes a negative energy balance of [phone] kcal/d;Understanding recommendations for meals to include 15-35% energy as protein, 25-35% energy from fat, 35-60% energy from carbohydrates, less than 257m of dietary cholesterol, 20-35 gm of total fiber daily;Understanding of distribution of calorie intake throughout the day with the consumption of 4-5 meals/snacks    Hypertension Yes    Intervention Provide education on lifestyle modifcations including regular physical activity/exercise, weight management, moderate sodium restriction and increased consumption of fresh fruit, vegetables, and low fat dairy, alcohol moderation, and smoking cessation.;Monitor prescription use compliance.    Expected Outcomes Short Term: Continued assessment and intervention until BP is < 140/979mHG in hypertensive participants. < 130/8032mG in  hypertensive participants with diabetes, heart failure or chronic kidney disease.;Long Term: Maintenance of blood pressure at goal levels.    Lipids Yes    Intervention Provide education and support for participant on nutrition & aerobic/resistive exercise along with prescribed medications to achieve LDL <54m26mDL >40mg90m Expected Outcomes Short Term: Participant states understanding of desired cholesterol values and is compliant with medications prescribed. Participant is following exercise prescription and nutrition guidelines.;Long Term: Cholesterol controlled with medications as prescribed, with individualized exercise RX and with personalized nutrition plan. Value goals: LDL < 54mg,89m > 40 mg.             Education:Diabetes - Individual verbal and written instruction to review signs/symptoms of diabetes, desired ranges of glucose level fasting, after meals and with exercise. Acknowledge that pre and post exercise glucose checks will be done for 3 sessions at entry of program.   Core Components/Risk Factors/Patient Goals Review:   Goals and Risk Factor Review     Row Name 01/14/22 0725 02/11/22 0940 03/22/22 0802         Core Components/Risk Factors/Patient Goals Review   Personal Goals Review Weight Management/Obesity;Hypertension;Lipids Weight Management/Obesity;Hypertension;Lipids Weight Management/Obesity;Hypertension;Lipids     Review Patient reports that he takes all medications as prescribed. His blood pressures have been in acceptable ranges and his weight has been steady. He attends cardiac rehab regularly and continues in an active lifestyle to help maintain weight and control risk factors. Rod is doing well in rehab.  His weight continues  to stay steady.  His pressures are doing well.  He will be out until 12/20 due to school and work obligations and upcoming retirement. Rod returned to rehab today.  His weight is up some as he has not been as active do to his knee pain.   His pressures are doing well.     Expected Outcomes Short: continue to attend cardiac rehab classes with consistent attendance. Long: monitor and control cardiac risk factors with healthy lifestyle choices. Continue to monitor risk factors while out. Short; Get back to routine and lose weight Long: continue to monitor risk factors.              Core Components/Risk Factors/Patient Goals at Discharge (Final Review):   Goals and Risk Factor Review - 03/22/22 0802       Core Components/Risk Factors/Patient Goals Review   Personal Goals Review Weight Management/Obesity;Hypertension;Lipids    Review Rod returned to rehab today.  His weight is up some as he has not been as active do to his knee pain.  His pressures are doing well.    Expected Outcomes Short; Get back to routine and lose weight Long: continue to monitor risk factors.             ITP Comments:  ITP Comments     Row Name 12/14/21 1329 12/20/21 1136 12/24/21 0733 01/02/22 0656 01/30/22 0910   ITP Comments Virtual orientation call completed today. he has an appointment on Date: 12/20/2021  for EP eval and gym Orientation.  Documentation of diagnosis can be found in [facility] Date: 11/01/2021 . Completed 6MWT and gym orientation. Initial ITP created and sent for review to Dr. Emily Filbert, Medical Director. First full day of exercise!  Patient was oriented to gym and equipment including functions, settings, policies, and procedures.  Patient's individual exercise prescription and treatment plan were reviewed.  All starting workloads were established based on the results of the 6 minute walk test done at initial orientation visit.  The plan for exercise progression was also introduced and progression will be customized based on patient's performance and goals. 30 Day review completed. Medical Director ITP review done, changes made as directed, and signed approval by Medical Director.   New to program 30 Day review completed. Medical Director  ITP review done, changes made as directed, and signed approval by Medical Director.    Row Name 02/11/22 [phone] 02/27/22 0743 03/22/22 0755 03/27/22 0953     ITP Comments Rod will be out until 12/20 due to other obligations with school and wrapping up to retirement. 30 Day review completed. Medical Director ITP review done, changes made as directed, and signed approval by Medical Director. Rod returned today after being out with school acitivties since 02/11/22. 30 Day review completed. Medical Director ITP review done, changes made as directed, and signed approval by Medical Director.             Comments:

## 2022-03-29 ENCOUNTER — Encounter: Payer: BC Managed Care – PPO | Admitting: *Deleted

## 2022-03-29 DIAGNOSIS — Z955 Presence of coronary angioplasty implant and graft: Secondary | ICD-10-CM

## 2022-03-29 NOTE — Progress Notes (Signed)
Daily Session Note  Patient Details  Name: Jose Mckenzie MRN: [id number] Date of Birth: July 11, 1955 Referring Provider:   Flowsheet Row Cardiac Rehab from 12/20/2021 in [facility] Cardiac and Pulmonary Rehab  Referring Provider [name] MD       Encounter Date: 03/29/2022  Check In:  Session Check In - 03/29/22 0813       Check-In   Supervising physician immediately available to respond to emergencies See telemetry face sheet for immediately available ER MD    Location [facility]-Cardiac & Pulmonary Rehab    Staff Present [name], RN, BSN, CCRP;Jessica Sherman, MA, RCEP, CCRP, CCET;Joseph Calhoun, [place]    Virtual Visit No    Medication changes reported     No    Fall or balance concerns reported    No    Warm-up and Cool-down Performed on first and last piece of equipment    Resistance Training Performed Yes    VAD Patient? No    PAD/SET Patient? No      Pain Assessment   Currently in Pain? No/denies                Social History   Tobacco Use  Smoking Status Never  Smokeless Tobacco Never    Goals Met:  Independence with exercise equipment Exercise tolerated well No report of concerns or symptoms today  Goals Unmet:  Not Applicable  Comments: Pt able to follow exercise prescription today without complaint.  Will continue to monitor for progression.    Dr. Emily Filbert is Medical Director for [place].  Dr. Ottie Glazier is Medical Director for [facility] Pulmonary Rehabilitation.

## 2022-04-03 ENCOUNTER — Encounter: Payer: Medicare Other | Attending: Internal Medicine | Admitting: *Deleted

## 2022-04-03 VITALS — Ht 70.0 in | Wt 183.3 lb

## 2022-04-03 DIAGNOSIS — Z48812 Encounter for surgical aftercare following surgery on the circulatory system: Secondary | ICD-10-CM | POA: Insufficient documentation

## 2022-04-03 DIAGNOSIS — Z955 Presence of coronary angioplasty implant and graft: Secondary | ICD-10-CM

## 2022-04-03 NOTE — Progress Notes (Signed)
Daily Session Note  Patient Details  Name: Armanie Martine MRN: [id number] Date of Birth: 1955-09-18 Referring Provider:   Flowsheet Row Cardiac Rehab from 12/20/2021 in [facility] Cardiac and Pulmonary Rehab  Referring Provider [name] MD       Encounter Date: 04/03/2022  Check In:  Session Check In - 04/03/22 0723       Check-In   Supervising physician immediately available to respond to emergencies See telemetry face sheet for immediately available ER MD    Location [facility]-Cardiac & Pulmonary Rehab    Staff Present Darlyne Russian, RN, Dimple Nanas, BS, Exercise Physiologist;Joseph Tessie Fass, [place]    Virtual Visit No    Medication changes reported     No    Fall or balance concerns reported    No    Warm-up and Cool-down Performed on first and last piece of equipment    Resistance Training Performed Yes    VAD Patient? No    PAD/SET Patient? No      Pain Assessment   Currently in Pain? No/denies                Social History   Tobacco Use  Smoking Status Never  Smokeless Tobacco Never    Goals Met:  Independence with exercise equipment Exercise tolerated well No report of concerns or symptoms today Strength training completed today  Goals Unmet:  Not Applicable  Comments: Pt able to follow exercise prescription today without complaint.  Will continue to monitor for progression.    Dr. Emily Filbert is Medical Director for [place].  Dr. Ottie Glazier is Medical Director for [facility] Pulmonary Rehabilitation.

## 2022-04-03 NOTE — Patient Instructions (Signed)
Discharge Patient Instructions  Patient Details  Name: Jose Mckenzie MRN: [id number] Date of Birth: [birth year] Referring Provider:  No ref. provider found   Number of Visits: 36  Reason for Discharge:  Patient reached a stable level of exercise. Patient independent in their exercise. Patient has met program and personal goals.  Diagnosis:  Status post coronary artery stent placement  Initial Exercise Prescription:  Initial Exercise Prescription - 12/20/21 1100       Date of Initial Exercise RX and Referring Provider   Date 12/20/21    Referring Provider [name] MD      Oxygen   Maintain Oxygen Saturation 88% or higher      Treadmill   MPH 3.3    Grade 1    Minutes 15    METs 3.98      REL-XR   Level 3    Speed 50    Minutes 15    METs 3      T5 Nustep   Level 3    SPM 80    Minutes 15    METs 3      Track   Laps 42    Minutes 15    METs 3.28      Prescription Details   Frequency (times per week) 3    Duration Progress to 30 minutes of continuous aerobic without signs/symptoms of physical distress      Intensity   THRR 40-80% of Max Heartrate 95-133    Ratings of Perceived Exertion 11-13    Perceived Dyspnea 0-4      Progression   Progression Continue to progress workloads to maintain intensity without signs/symptoms of physical distress.      Resistance Training   Training Prescription Yes    Weight 5 lb    Reps 10-15             Discharge Exercise Prescription (Final Exercise Prescription Changes):  Exercise Prescription Changes - 03/28/22 1100       Response to Exercise   Blood Pressure (Admit) 112/70    Blood Pressure (Exit) 114/72    Heart Rate (Admit) 60 bpm    Heart Rate (Exercise) 108 bpm    Heart Rate (Exit) 72 bpm    Oxygen Saturation (Admit) 95 %    Oxygen Saturation (Exercise) 97 %    Oxygen Saturation (Exit) 96 %    Rating of Perceived Exertion (Exercise) 12    Symptoms none    Duration Continue with 30 min of  aerobic exercise without signs/symptoms of physical distress.    Intensity THRR unchanged      Progression   Progression Continue to progress workloads to maintain intensity without signs/symptoms of physical distress.    Average METs 4.73      Resistance Training   Training Prescription Yes    Weight 6 lb    Reps 10-15      Interval Training   Interval Training Yes    Equipment Treadmill    Comments running      Treadmill   MPH 4.2    Grade 1.5    Minutes 15    METs 7.9      NuStep   Level 4    Minutes 15    METs 3.8      Recumbant Elliptical   Level 2.5    Minutes 15    METs 2.5      Home Exercise Plan   Plans to continue exercise at Home (comment)  walking, running   Frequency Add 2 additional days to program exercise sessions.    Initial Home Exercises Provided 01/04/22      Oxygen   Maintain Oxygen Saturation 88% or higher             Functional Capacity:  6 Minute Walk     Row Name 12/20/21 1137 04/03/22 0736       6 Minute Walk   Phase Initial Discharge    Distance 1675 feet 1800 feet    Distance % Change -- 7.5 %    Distance Feet Change -- 125 ft    Walk Time 6 minutes 6 minutes    # of Rest Breaks 0 0    MPH 3.17 3.41    METS 3.69 3.91    RPE 7 11    Perceived Dyspnea  -- 0    VO2 Peak 12.87 13.69    Symptoms No No    Resting HR 57 bpm 61 bpm    Resting BP 128/64 126/76    Resting Oxygen Saturation  98 % 95 %    Exercise Oxygen Saturation  during 6 min walk 97 % 98 %    Max Ex. HR 93 bpm 90 bpm    Max Ex. BP 128/74 132/78    2 Minute Post BP 112/62 --            Nutrition & Weight - Outcomes:  Pre Biometrics - 12/20/21 1143       Pre Biometrics   Height _0  (1.778 m)    Weight 185 lb 4.8 oz (84.1 kg)    Waist Circumference 35.5 inches    Hip Circumference 39 inches    Waist to Hip Ratio 0.91 %    BMI (Calculated) 26.59    Single Leg Stand 30 seconds             Post Biometrics - 04/03/22 0738        Post   Biometrics   Height _1  (1.778 m)    Weight 183 lb 4.8 oz (83.1 kg)    Waist Circumference 35.5 inches    Hip Circumference 39 inches    Waist to Hip Ratio 0.91 %    BMI (Calculated) 26.3    Single Leg Stand 30 seconds             Nutrition:  Nutrition Therapy & Goals - 12/26/21 1428       Nutrition Therapy   RD appointment deferred Yes   Pt feels he is doing well with his nutrition and is not interested in meeting with RD at this time. He came to nutrition education 12/26/21. Will continue to follow up.     Personal Nutrition Goals   Nutrition Goal Pt feels he is doing well with his nutrition and is not interested in meeting with RD at this time. He came to nutrition education 12/26/21. Will continue to follow up.

## 2022-04-05 ENCOUNTER — Encounter: Payer: Medicare Other | Admitting: *Deleted

## 2022-04-05 DIAGNOSIS — Z955 Presence of coronary angioplasty implant and graft: Secondary | ICD-10-CM | POA: Diagnosis not present

## 2022-04-05 NOTE — Progress Notes (Signed)
Daily Session Note  Patient Details  Name: Lyriq Jarchow MRN: [id number] Date of Birth: [birth year] Referring Provider:   Flowsheet Row Cardiac Rehab from 12/20/2021 in [facility] Cardiac and Pulmonary Rehab  Referring Provider [name] MD       Encounter Date: 04/05/2022  Check In:  Session Check In - 04/05/22 1010       Check-In   Supervising physician immediately available to respond to emergencies See telemetry face sheet for immediately available ER MD    Location [facility]-Cardiac & Pulmonary Rehab    Staff Present [name], RN, BSN, CCRP;Jessica [place], MA, RCEP, CCRP, CCET;Joseph Flora, [place]    Virtual Visit No    Medication changes reported     No    Fall or balance concerns reported    No    Warm-up and Cool-down Performed on first and last piece of equipment    Resistance Training Performed Yes    VAD Patient? No    PAD/SET Patient? No      Pain Assessment   Currently in Pain? No/denies                Social History   Tobacco Use  Smoking Status Never  Smokeless Tobacco Never    Goals Met:  Independence with exercise equipment Exercise tolerated well No report of concerns or symptoms today  Goals Unmet:  Not Applicable  Comments: Pt able to follow exercise prescription today without complaint.  Will continue to monitor for progression.    Dr. Emily Filbert is Medical Director for [facility].  Dr. Ottie Glazier is Medical Director for [facility] Pulmonary Rehabilitation.

## 2022-04-08 ENCOUNTER — Encounter: Payer: Medicare Other | Admitting: *Deleted

## 2022-04-08 DIAGNOSIS — Z955 Presence of coronary angioplasty implant and graft: Secondary | ICD-10-CM

## 2022-04-08 NOTE — Progress Notes (Signed)
Daily Session Note  Patient Details  Name: Jose Mckenzie MRN: [id number] Date of Birth: 1956/01/11 Referring Provider:   Flowsheet Row Cardiac Rehab from 12/20/2021 in [facility] Cardiac and Pulmonary Rehab  Referring Provider [name] MD       Encounter Date: 04/08/2022  Check In:  Session Check In - 04/08/22 0754       Check-In   Supervising physician immediately available to respond to emergencies See telemetry face sheet for immediately available ER MD    Location [facility]-Cardiac & Pulmonary Rehab    Staff Present Darlyne Russian, RN, Doyce Para, BS, ACSM CEP, Exercise Physiologist;Joseph Tessie Fass, [place]    Virtual Visit No    Medication changes reported     No    Fall or balance concerns reported    No    Warm-up and Cool-down Performed on first and last piece of equipment    Resistance Training Performed Yes    VAD Patient? No    PAD/SET Patient? No      Pain Assessment   Currently in Pain? No/denies                Social History   Tobacco Use  Smoking Status Never  Smokeless Tobacco Never    Goals Met:  Independence with exercise equipment Exercise tolerated well No report of concerns or symptoms today Strength training completed today  Goals Unmet:  Not Applicable  Comments: Pt able to follow exercise prescription today without complaint.  Will continue to monitor for progression.    Dr. Emily Filbert is Medical Director for [place].  Dr. Ottie Glazier is Medical Director for [facility] Pulmonary Rehabilitation.

## 2022-04-10 ENCOUNTER — Encounter: Payer: Medicare Other | Admitting: *Deleted

## 2022-04-10 DIAGNOSIS — Z955 Presence of coronary angioplasty implant and graft: Secondary | ICD-10-CM | POA: Diagnosis not present

## 2022-04-10 NOTE — Progress Notes (Signed)
Daily Session Note  Patient Details  Name: Jose Mckenzie MRN: [id number] Date of Birth: 1956/03/27 Referring Provider:   Flowsheet Row Cardiac Rehab from 12/20/2021 in [facility] Cardiac and Pulmonary Rehab  Referring Provider [name] MD       Encounter Date: 04/10/2022  Check In:  Session Check In - 04/10/22 0750       Check-In   Supervising physician immediately available to respond to emergencies See telemetry face sheet for immediately available ER MD    Location [facility]-Cardiac & Pulmonary Rehab    Staff Present Darlyne Russian, RN, ADN;Joseph Tessie Fass, RCP,RRT,BSRT;Noah Tickle, BS, Exercise Physiologist    Virtual Visit No    Medication changes reported     No    Fall or balance concerns reported    No    Warm-up and Cool-down Performed on first and last piece of equipment    Resistance Training Performed Yes    VAD Patient? No    PAD/SET Patient? No      Pain Assessment   Currently in Pain? No/denies                Social History   Tobacco Use  Smoking Status Never  Smokeless Tobacco Never    Goals Met:  Independence with exercise equipment Exercise tolerated well No report of concerns or symptoms today Strength training completed today  Goals Unmet:  Not Applicable  Comments: Pt able to follow exercise prescription today without complaint.  Will continue to monitor for progression.    Dr. Emily Filbert is Medical Director for [place].  Dr. Ottie Glazier is Medical Director for [facility] Pulmonary Rehabilitation.

## 2022-04-12 ENCOUNTER — Encounter: Payer: Medicare Other | Admitting: *Deleted

## 2022-04-12 DIAGNOSIS — Z955 Presence of coronary angioplasty implant and graft: Secondary | ICD-10-CM

## 2022-04-12 NOTE — Progress Notes (Signed)
Daily Session Note  Patient Details  Name: Jose Mckenzie MRN: [id number] Date of Birth: [birth year] Referring Provider:   Flowsheet Row Cardiac Rehab from 12/20/2021 in [facility] Cardiac and Pulmonary Rehab  Referring Provider [name] MD       Encounter Date: 04/12/2022  Check In:  Session Check In - 04/12/22 0805       Check-In   Supervising physician immediately available to respond to emergencies See telemetry face sheet for immediately available ER MD    Location [facility]-Cardiac & Pulmonary Rehab    Staff Present [name], RN, BSN, CCRP;Jessica [place], MA, RCEP, CCRP, CCET;Joseph [place], [place]    Virtual Visit No    Medication changes reported     No    Fall or balance concerns reported    No    Warm-up and Cool-down Performed on first and last piece of equipment    Resistance Training Performed Yes    VAD Patient? No    PAD/SET Patient? No      Pain Assessment   Currently in Pain? No/denies                Social History   Tobacco Use  Smoking Status Never  Smokeless Tobacco Never    Goals Met:  Independence with exercise equipment Exercise tolerated well Personal goals reviewed No report of concerns or symptoms today  Goals Unmet:  Not Applicable  Comments:  Jose Mckenzie graduated today from  rehab with 36 sessions completed.  Details of the patient's exercise prescription and what He needs to do in order to continue the prescription and progress were discussed with patient.  Patient was given a copy of prescription and goals.  Patient verbalized understanding.  Jose Mckenzie plans to continue to exercise by running at home.    Dr. Emily Filbert is Medical Director for [place].  Dr. Ottie Glazier is Medical Director for [facility] Pulmonary Rehabilitation.

## 2022-04-12 NOTE — Progress Notes (Signed)
Cardiac Individual Treatment Plan  Patient Details  Name: Jose Mckenzie MRN: [id number] Date of Birth: 03/12/1956 Referring Provider:   Flowsheet Row Cardiac Rehab from 12/20/2021 in [facility] Cardiac and Pulmonary Rehab  Referring Provider [name] MD       Initial Encounter Date:  Flowsheet Row Cardiac Rehab from 12/20/2021 in [facility] Cardiac and Pulmonary Rehab  Date 12/20/21       Visit Diagnosis: Status post coronary artery stent placement  Patient's Home Medications on Admission:  Current Outpatient Medications:    aspirin EC 81 MG tablet, Take 1 tablet (81 mg total) by mouth daily. Swallow whole., Disp: 90 tablet, Rfl: 3   losartan (COZAAR) 50 MG tablet, Take 1 tablet (50 mg total) by mouth daily., Disp: 30 tablet, Rfl: 11   metoprolol succinate (TOPROL XL) 25 MG 24 hr tablet, Take 1 tablet (25 mg total) by mouth daily., Disp: 30 tablet, Rfl: 11   rosuvastatin (CRESTOR) 20 MG tablet, Take 2 tablets (40 mg total) by mouth daily., Disp: 30 tablet, Rfl: 11   spironolactone (ALDACTONE) 25 MG tablet, Take 12.5 mg by mouth daily., Disp: , Rfl:    ticagrelor (BRILINTA) 90 MG TABS tablet, Take 1 tablet (90 mg total) by mouth 2 (two) times daily., Disp: 60 tablet, Rfl: 0  Past Medical History: Past Medical History:  Diagnosis Date   Hx of dysplastic nevus 2015   multiple sites    Tobacco Use: Social History   Tobacco Use  Smoking Status Never  Smokeless Tobacco Never    Labs: Review Flowsheet        No data to display           Exercise Target Goals: Exercise Program Goal: Individual exercise prescription set using results from initial 6 min walk test and THRR while considering  patient's activity barriers and safety.   Exercise Prescription Goal: Initial exercise prescription builds to 30-45 minutes a day of aerobic activity, 2-3 days per week.  Home exercise guidelines will be given to patient during program as part of exercise prescription that the  participant will acknowledge.   Education: Aerobic Exercise: - Group verbal and visual presentation on the components of exercise prescription. Introduces F.I.T.T principle from ACSM for exercise prescriptions.  Reviews F.I.T.T. principles of aerobic exercise including progression. Written material given at graduation.   Education: Resistance Exercise: - Group verbal and visual presentation on the components of exercise prescription. Introduces F.I.T.T principle from ACSM for exercise prescriptions  Reviews F.I.T.T. principles of resistance exercise including progression. Written material given at graduation.    Education: Exercise & Equipment Safety: - Individual verbal instruction and demonstration of equipment use and safety with use of the equipment. Flowsheet Row Cardiac Rehab from 04/10/2022 in [facility] Cardiac and Pulmonary Rehab  Date 12/20/21  Educator [facility]  Instruction Review Code 1- Verbalizes Understanding       Education: Exercise Physiology & General Exercise Guidelines: - Group verbal and written instruction with models to review the exercise physiology of the cardiovascular system and associated critical values. Provides general exercise guidelines with specific guidelines to those with heart or lung disease.  Flowsheet Row Cardiac Rehab from 04/10/2022 in [facility] Cardiac and Pulmonary Rehab  Date 04/10/22  Educator [facility]  Instruction Review Code 1- Verbalizes Understanding       Education: Flexibility, Balance, Mind/Body Relaxation: - Group verbal and visual presentation with interactive activity on the components of exercise prescription. Introduces F.I.T.T principle from ACSM for exercise prescriptions. Reviews F.I.T.T. principles of flexibility and  balance exercise training including progression. Also discusses the mind body connection.  Reviews various relaxation techniques to help reduce and manage stress (i.e. Deep breathing, progressive muscle relaxation, and visualization).  Balance handout provided to take home. Written material given at graduation.   Activity Barriers & Risk Stratification:  Activity Barriers & Cardiac Risk Stratification - 12/20/21 1138       Activity Barriers & Cardiac Risk Stratification   Activity Barriers Back Problems;Deconditioning    Cardiac Risk Stratification Moderate             6 Minute Walk:  6 Minute Walk     Row Name 12/20/21 1137 04/03/22 0736       6 Minute Walk   Phase Initial Discharge    Distance 1675 feet 1800 feet    Distance % Change -- 7.5 %    Distance Feet Change -- 125 ft    Walk Time 6 minutes 6 minutes    # of Rest Breaks 0 0    MPH 3.17 3.41    METS 3.69 3.91    RPE 7 11    Perceived Dyspnea  -- 0    VO2 Peak 12.87 13.69    Symptoms No No    Resting HR 57 bpm 61 bpm    Resting BP 128/64 126/76    Resting Oxygen Saturation  98 % 95 %    Exercise Oxygen Saturation  during 6 min walk 97 % 98 %    Max Ex. HR 93 bpm 90 bpm    Max Ex. BP 128/74 132/78    2 Minute Post BP 112/62 --             Oxygen Initial Assessment:   Oxygen Re-Evaluation:   Oxygen Discharge (Final Oxygen Re-Evaluation):   Initial Exercise Prescription:  Initial Exercise Prescription - 12/20/21 1100       Date of Initial Exercise RX and Referring Provider   Date 12/20/21    Referring Provider Dorothyann Pengallwood, Dwayne MD      Oxygen   Maintain Oxygen Saturation 88% or higher      Treadmill   MPH 3.3    Grade 1    Minutes 15    METs 3.98      REL-XR   Level 3    Speed 50    Minutes 15    METs 3      T5 Nustep   Level 3    SPM 80    Minutes 15    METs 3      Track   Laps 42    Minutes 15    METs 3.28      Prescription Details   Frequency (times per week) 3    Duration Progress to 30 minutes of continuous aerobic without signs/symptoms of physical distress      Intensity   THRR 40-80% of Max Heartrate 95-133    Ratings of Perceived Exertion 11-13    Perceived Dyspnea 0-4       Progression   Progression Continue to progress workloads to maintain intensity without signs/symptoms of physical distress.      Resistance Training   Training Prescription Yes    Weight 5 lb    Reps 10-15             Perform Capillary Blood Glucose checks as needed.  Exercise Prescription Changes:   Exercise Prescription Changes     Row Name 12/20/21 1100 01/01/22 1400 01/04/22 0800 01/15/22 1400 01/28/22 1100  Response to Exercise   Blood Pressure (Admit) 128/64 122/62 -- 118/72 102/60   Blood Pressure (Exercise) 128/74 142/80 -- 160/80 --   Blood Pressure (Exit) 112/64 106/62 -- 112/68 120/60   Heart Rate (Admit) 57 bpm 55 bpm -- 51 bpm 59 bpm   Heart Rate (Exercise) 93 bpm 111 bpm -- 133 bpm 132 bpm   Heart Rate (Exit) 68 bpm 76 bpm -- 68 bpm 65 bpm   Oxygen Saturation (Admit) 98 % -- -- -- --   Oxygen Saturation (Exercise) 97 % -- -- -- --   Rating of Perceived Exertion (Exercise) 7 13 -- 13 13   Symptoms none none -- none none   Comments walk test results First three days of exercise -- -- --   Duration -- Continue with 30 min of aerobic exercise without signs/symptoms of physical distress. -- Continue with 30 min of aerobic exercise without signs/symptoms of physical distress. Continue with 30 min of aerobic exercise without signs/symptoms of physical distress.   Intensity -- THRR unchanged -- THRR unchanged THRR unchanged     Progression   Progression -- Continue to progress workloads to maintain intensity without signs/symptoms of physical distress. -- Continue to progress workloads to maintain intensity without signs/symptoms of physical distress. Continue to progress workloads to maintain intensity without signs/symptoms of physical distress.   Average METs -- 3.66 -- 4.69 6.16     Resistance Training   Training Prescription -- Yes -- Yes Yes   Weight -- 5 lb -- 5 lb 6 lb   Reps -- 10-15 -- 10-15 10-15     Interval Training   Interval Training -- No -- No  Yes   Equipment -- -- -- -- Treadmill   Comments -- -- -- -- 4-8.5% incline     Treadmill   MPH -- 3.6 -- 6.5 4.6  intervals   Grade -- 3 -- 3 7.5   Minutes -- 15 -- 15 15   METs -- 5.25 -- 8.67 11.2     Elliptical   Level -- -- -- -- 1   Speed -- -- -- -- 2.5   Minutes -- -- -- -- 15     REL-XR   Level -- 3 -- 3 7   Minutes -- 15 -- 15 15   METs -- 4.2 -- 5.7 7.4     T5 Nustep   Level -- 3 -- 5 4   Minutes -- 15 -- 15 15   METs -- 2.6 -- 3.8 3.2     Track   Laps -- 36 -- 45 --   Minutes -- 15 -- 15 --   METs -- 2.96 -- 3.45 --     Home Exercise Plan   Plans to continue exercise at -- -- Home (comment)  walking, running Home (comment)  walking, running Home (comment)  walking, running   Frequency -- -- Add 2 additional days to program exercise sessions. Add 2 additional days to program exercise sessions. Add 2 additional days to program exercise sessions.   Initial Home Exercises Provided -- -- 01/04/22 01/04/22 01/04/22     Oxygen   Maintain Oxygen Saturation -- 88% or higher -- 88% or higher 88% or higher    Row Name 02/11/22 1700 03/28/22 1100 04/08/22 1400         Response to Exercise   Blood Pressure (Admit) 124/70 112/70 116/62     Blood Pressure (Exit) 102/62 114/72 106/62     Heart Rate (Admit)  65 bpm 60 bpm 65 bpm     Heart Rate (Exercise) 151 bpm 108 bpm 113 bpm     Heart Rate (Exit) 69 bpm 72 bpm 100 bpm     Oxygen Saturation (Admit) -- 95 % 96 %     Oxygen Saturation (Exercise) -- 97 % 95 %     Oxygen Saturation (Exit) -- 96 % 96 %     Rating of Perceived Exertion (Exercise) 15 12 13      Symptoms none none none     Duration Continue with 30 min of aerobic exercise without signs/symptoms of physical distress. Continue with 30 min of aerobic exercise without signs/symptoms of physical distress. Continue with 30 min of aerobic exercise without signs/symptoms of physical distress.     Intensity THRR unchanged THRR unchanged THRR unchanged        Progression   Progression Continue to progress workloads to maintain intensity without signs/symptoms of physical distress. Continue to progress workloads to maintain intensity without signs/symptoms of physical distress. Continue to progress workloads to maintain intensity without signs/symptoms of physical distress.     Average METs 4.88 4.73 6.33       Resistance Training   Training Prescription Yes Yes Yes     Weight 6 lb 6 lb 6 lb     Reps 10-15 10-15 10-15       Interval Training   Interval Training Yes Yes Yes     Equipment Treadmill Treadmill Treadmill     Comments intervals of incline 5-8%, also intervals of speed 3.2-8 mph running running intervals up to 8 mph       Treadmill   MPH 3.5  intervals 4.2 7.1     Grade 5  intervals 1.5 2.5     Minutes 15 15 15      METs 6.09 7.9 8.88       NuStep   Level -- 4 6     Minutes -- 15 15     METs -- 3.8 5.8       Recumbant Elliptical   Level -- 2.5 --     Minutes -- 15 --     METs -- 2.5 --       Elliptical   Level 2 -- --     Speed 5.5 -- --     Minutes 15 -- --       REL-XR   Level 5 -- --     Minutes 15 -- --     METs 4.3 -- --       T5 Nustep   Level 4 -- --     Minutes 15 -- --     METs 2.9 -- --       Home Exercise Plan   Plans to continue exercise at Home (comment)  walking, running Home (comment)  walking, running Home (comment)  walking, running     Frequency Add 2 additional days to program exercise sessions. Add 2 additional days to program exercise sessions. Add 2 additional days to program exercise sessions.     Initial Home Exercises Provided 01/04/22 01/04/22 01/04/22       Oxygen   Maintain Oxygen Saturation 88% or higher 88% or higher 88% or higher              Exercise Comments:   Exercise Comments     Row Name 12/24/21 0175 04/12/22 0806         Exercise Comments First full day of exercise!  Patient was oriented to gym and equipment including functions, settings, policies, and  procedures.  Patient's individual exercise prescription and treatment plan were reviewed.  All starting workloads were established based on the results of the 6 minute walk test done at initial orientation visit.  The plan for exercise progression was also introduced and progression will be customized based on patient's performance and goals. Jose Mckenzie graduated today from  rehab with 36 sessions completed.  Details of the patient's exercise prescription and what He needs to do in order to continue the prescription and progress were discussed with patient.  Patient was given a copy of prescription and goals.  Patient verbalized understanding.  Jose Mckenzie plans to continue to exercise by running at home.               Exercise Goals and Review:   Exercise Goals     Row Name 12/20/21 1143             Exercise Goals   Increase Physical Activity Yes       Intervention Provide advice, education, support and counseling about physical activity/exercise needs.;Develop an individualized exercise prescription for aerobic and resistive training based on initial evaluation findings, risk stratification, comorbidities and participant's personal goals.       Expected Outcomes Short Term: Attend rehab on a regular basis to increase amount of physical activity.;Long Term: Add in home exercise to make exercise part of routine and to increase amount of physical activity.;Long Term: Exercising regularly at least 3-5 days a week.       Increase Strength and Stamina Yes       Intervention Provide advice, education, support and counseling about physical activity/exercise needs.;Develop an individualized exercise prescription for aerobic and resistive training based on initial evaluation findings, risk stratification, comorbidities and participant's personal goals.       Expected Outcomes Short Term: Increase workloads from initial exercise prescription for resistance, speed, and METs.;Short Term: Perform resistance training  exercises routinely during rehab and add in resistance training at home;Long Term: Improve cardiorespiratory fitness, muscular endurance and strength as measured by increased METs and functional capacity ( )       Able to understand and use rate of perceived exertion (RPE) scale Yes       Intervention Provide education and explanation on how to use RPE scale       Expected Outcomes Short Term: Able to use RPE daily in rehab to express subjective intensity level;Long Term:  Able to use RPE to guide intensity level when exercising independently       Able to understand and use Dyspnea scale Yes       Intervention Provide education and explanation on how to use Dyspnea scale       Expected Outcomes Short Term: Able to use Dyspnea scale daily in rehab to express subjective sense of shortness of breath during exertion;Long Term: Able to use Dyspnea scale to guide intensity level when exercising independently       Knowledge and understanding of Target Heart Rate Range (THRR) Yes       Intervention Provide education and explanation of THRR including how the numbers were predicted and where they are located for reference       Expected Outcomes Short Term: Able to state/look up THRR;Short Term: Able to use daily as guideline for intensity in rehab;Long Term: Able to use THRR to govern intensity when exercising independently       Able to check pulse independently Yes  Intervention Provide education and demonstration on how to check pulse in carotid and radial arteries.;Review the importance of being able to check your own pulse for safety during independent exercise       Expected Outcomes Long Term: Able to check pulse independently and accurately;Short Term: Able to explain why pulse checking is important during independent exercise       Understanding of Exercise Prescription Yes       Intervention Provide education, explanation, and written materials on patient's individual exercise prescription        Expected Outcomes Long Term: Able to explain home exercise prescription to exercise independently;Short Term: Able to explain program exercise prescription                Exercise Goals Re-Evaluation :  Exercise Goals Re-Evaluation     Row Name 12/24/21 0733 01/01/22 1457 01/04/22 0807 01/15/22 1416 01/28/22 1153     Exercise Goal Re-Evaluation   Exercise Goals Review Increase Physical Activity;Able to understand and use rate of perceived exertion (RPE) scale;Increase Strength and Stamina;Able to understand and use Dyspnea scale;Knowledge and understanding of Target Heart Rate Range (THRR);Understanding of Exercise Prescription Increase Physical Activity;Increase Strength and Stamina;Understanding of Exercise Prescription Increase Physical Activity;Increase Strength and Stamina;Understanding of Exercise Prescription;Able to understand and use rate of perceived exertion (RPE) scale;Knowledge and understanding of Target Heart Rate Range (THRR);Able to understand and use Dyspnea scale;Able to check pulse independently Increase Physical Activity;Increase Strength and Stamina;Understanding of Exercise Prescription;Able to understand and use rate of perceived exertion (RPE) scale;Knowledge and understanding of Target Heart Rate Range (THRR);Able to understand and use Dyspnea scale;Able to check pulse independently Increase Physical Activity;Increase Strength and Stamina;Understanding of Exercise Prescription   Comments Reviewed RPE and dyspnea scales, THR and program prescription with pt today.  Pt voiced understanding and was given a copy of goals to take home. Jose Mckenzie is off to a good start in rehab. He had an overall average MET level of 3.66 METs. He also increased his workload on the treadmill to a speed of 3.6 mph and an incline of 3.0%. He has tolerated using 5 lb hand weights as well. We will continue to monitor his progress in the program. Reviewed home exercise with pt today.  Pt plans to walk  and run at home for exercise.  Reviewed THR, pulse, RPE, sign and symptoms, pulse oximetery and when to call 911 or MD.  Also discussed weather considerations and indoor options.  Pt voiced understanding. Jose Mckenzie continues to do well in rehab. He recently increased his overall average MET level to 4.69 METs. He also increased his workload on the treadmill to a speed of 6.5 mph and an incline of 3%. He also got up to 45 laps on the track as well. We will continue to monitor his progress. Jose Mckenzie continues to do well in rehab. He has increased to level 7 on the XR and started doing intervals on the treadmill, ranging anywhere between 3.3-4.6 mph and 3-8.5% incline. He tried the elliptical and was able to work on level 1. His handweights for resistance training are now at 6 lbs. We will continue to monitor.   Expected Outcomes Short: Use RPE daily to regulate intensity. Long: Follow program prescription in THR. Short: Continue to increase workloads. Long: Continue to increase strength and stamina. Short: Start to add in more running Long:Continue to improve stamina Short: try 6 lb weights. Long:Continue to improve strength and stamina. Short: Continue working on elliptical, increase workload when appropriate Long:  Continue to increase overall MET level    Row Name 02/11/22 0936 02/11/22 1749 02/25/22 1333 03/22/22 0756 03/28/22 1157     Exercise Goal Re-Evaluation   Exercise Goals Review Increase Physical Activity;Increase Strength and Stamina;Understanding of Exercise Prescription Increase Physical Activity;Increase Strength and Stamina;Understanding of Exercise Prescription Increase Physical Activity;Increase Strength and Stamina;Understanding of Exercise Prescription Increase Physical Activity;Increase Strength and Stamina;Understanding of Exercise Prescription Increase Physical Activity;Increase Strength and Stamina;Understanding of Exercise Prescription   Comments Jose Mckenzie will be out unitl 12/20 due to school and work  obligations.  He will continue to exercise while out by running and training with his [employer] team.  They have their big event coming up. Jose Mckenzie is doing well inrehab. He has been doing intervals on the treadmill and running at 8 mph. He also improved to level 2 on the elliptical with a speed of 5.5 mph. He will be out unitl 12/20 due to school and work obligations.  He will continue to exercise while out by running and training with his [employer] team.  They have their big event coming up. Jose Mckenzie will continue to be out until 12/20 due to his school. He will continue to follow up once he returns. Jose Mckenzie returned to rehab today.  He is now using a knee brace on his left knee.  A couple weeks ago he started limping and struggling with pain.  He went to doctor and was put on predisone taper.  It has helped and he is walking normal again.   If it continues to act up, he will got for a cortisone injection.  He is slowly building back up to his walking full time again. Jose Mckenzie has started to increase his workloads back up again.  He is now at jogging 4.2 mph on treadmill. Yesterday, he did not report any pain in his knee.  We will continue to monitor his progress.   Expected Outcomes Conitnue to exercise while out on leave Short: Conitnue to exercise while out on leave. Long: Continue to increase strength and stamina. Short: Return to rehab Long: Complete [facility] program Short: return to regular routine again Long: Continue to build stamina Short: continue to work toward previoud workloads Long; Conitnue to get back to routine exercise    Row Name 04/08/22 0800 04/08/22 1502           Exercise Goal Re-Evaluation   Exercise Goals Review Increase Physical Activity;Increase Strength and Stamina;Understanding of Exercise Prescription Increase Physical Activity;Increase Strength and Stamina;Understanding of Exercise Prescription      Comments Jose Mckenzie is set to graduate this week.  He plans to continue to run, especially on warmer days,  to maintain his exercise routine.  He does feel better and comfortable jogging again. Jose Mckenzie is doing well in the program and is close to graduating. He recently completed his post 6MWT and improved by 7.5%. He also has been doing running intervals on the treadmill up to 8 mph. He improved to level 6 on the T4 as well. We will continue to monitor his progress until he graduates from the program.      Expected Outcomes Continue to exercise indpendently Short; graduate. Long: Continue to exercise indpendently.               Discharge Exercise Prescription (Final Exercise Prescription Changes):  Exercise Prescription Changes - 04/08/22 1400       Response to Exercise   Blood Pressure (Admit) 116/62    Blood Pressure (Exit) 106/62    Heart Rate (  Admit) 65 bpm    Heart Rate (Exercise) 113 bpm    Heart Rate (Exit) 100 bpm    Oxygen Saturation (Admit) 96 %    Oxygen Saturation (Exercise) 95 %    Oxygen Saturation (Exit) 96 %    Rating of Perceived Exertion (Exercise) 13    Symptoms none    Duration Continue with 30 min of aerobic exercise without signs/symptoms of physical distress.    Intensity THRR unchanged      Progression   Progression Continue to progress workloads to maintain intensity without signs/symptoms of physical distress.    Average METs 6.33      Resistance Training   Training Prescription Yes    Weight 6 lb    Reps 10-15      Interval Training   Interval Training Yes    Equipment Treadmill    Comments running intervals up to 8 mph      Treadmill   MPH 7.1    Grade 2.5    Minutes 15    METs 8.88      NuStep   Level 6    Minutes 15    METs 5.8      Home Exercise Plan   Plans to continue exercise at Home (comment)   walking, running   Frequency Add 2 additional days to program exercise sessions.    Initial Home Exercises Provided 01/04/22      Oxygen   Maintain Oxygen Saturation 88% or higher             Nutrition:  Target Goals: Understanding of  nutrition guidelines, daily intake of sodium 1500mg , cholesterol 200mg , calories 30% from fat and 7% or less from saturated fats, daily to have 5 or more servings of fruits and vegetables.  Education: All About Nutrition: -Group instruction provided by verbal, written material, interactive activities, discussions, models, and posters to present general guidelines for heart healthy nutrition including fat, fiber, MyPlate, the role of sodium in heart healthy nutrition, utilization of the nutrition label, and utilization of this knowledge for meal planning. Follow up email sent as well. Written material given at graduation. Flowsheet Row Cardiac Rehab from 04/10/2022 in [facility] Cardiac and Pulmonary Rehab  Education need identified 12/20/21  Date 12/26/21  Educator [facility]  Instruction Review Code 1- Verbalizes Understanding       Biometrics:  Pre Biometrics - 12/20/21 1143       Pre Biometrics   Height 5\' 10"  (1.778 m)    Weight 185 lb 4.8 oz (84.1 kg)    Waist Circumference 35.5 inches    Hip Circumference 39 inches    Waist to Hip Ratio 0.91 %    BMI (Calculated) 26.59    Single Leg Stand 30 seconds             Post Biometrics - 04/03/22 0738        Post  Biometrics   Height 5\' 10"  (1.778 m)    Weight 183 lb 4.8 oz (83.1 kg)    Waist Circumference 35.5 inches    Hip Circumference 39 inches    Waist to Hip Ratio 0.91 %    BMI (Calculated) 26.3    Single Leg Stand 30 seconds             Nutrition Therapy Plan and Nutrition Goals:  Nutrition Therapy & Goals - 12/26/21 1428       Nutrition Therapy   RD appointment deferred Yes   Pt feels he is doing  well with his nutrition and is not interested in meeting with RD at this time. He came to nutrition education 12/26/21. Will continue to follow up.     Personal Nutrition Goals   Nutrition Goal Pt feels he is doing well with his nutrition and is not interested in meeting with RD at this time. He came to nutrition education  12/26/21. Will continue to follow up.             Nutrition Assessments:  MEDIFICTS Score Key: ?70 Need to make dietary changes  40-70 Heart Healthy Diet ? 40 Therapeutic Level Cholesterol Diet  Flowsheet Row Cardiac Rehab from 12/20/2021 in [facility] Cardiac and Pulmonary Rehab  Picture Your Plate Total Score on Admission 85      Picture Your Plate Scores: <76 Unhealthy dietary pattern with much room for improvement. 41-50 Dietary pattern unlikely to meet recommendations for good health and room for improvement. 51-60 More healthful dietary pattern, with some room for improvement.  >60 Healthy dietary pattern, although there may be some specific behaviors that could be improved.    Nutrition Goals Re-Evaluation:  Nutrition Goals Re-Evaluation     Row Name 01/14/22 0727 02/11/22 0939 03/22/22 0801 04/08/22 0802       Goals   Nutrition Goal Pt feels he is doing well with his nutrition and is not interested in meeting with RD at this time. Pt feels he is doing well with his nutrition and is not interested in meeting with RD at this time. Pt feels he is doing well with his nutrition and is not interested in meeting with RD at this time. Heart Healthy Diet    Comment -- Continue to focus on heart healthy eating through holiday eating season.  He does not eat a lot of salt or sweets and feels that he has a good grasp on his eating at this point,. Jose Mckenzie has been doing well in rehab.  He was sticking to his diet other than the Christmas treats his kids have been bringing in for him.  He is also going to out to eat to celebrate his retirement.  Otherwise, he is doing well with his diet. Jose Mckenzie continues to focus on heart healthy eating and has gotten rid of the Christmas goodies.    Expected Outcome -- Continue to focus on heart healthy diet Continue to focus on heart healthy diet Continue to focus on heart healthy diet             Nutrition Goals Discharge (Final Nutrition Goals  Re-Evaluation):  Nutrition Goals Re-Evaluation - 04/08/22 0802       Goals   Nutrition Goal Heart Healthy Diet    Comment Jose Mckenzie continues to focus on heart healthy eating and has gotten rid of the Christmas goodies.    Expected Outcome Continue to focus on heart healthy diet             Psychosocial: Target Goals: Acknowledge presence or absence of significant depression and/or stress, maximize coping skills, provide positive support system. Participant is able to verbalize types and ability to use techniques and skills needed for reducing stress and depression.   Education: Stress, Anxiety, and Depression - Group verbal and visual presentation to define topics covered.  Reviews how body is impacted by stress, anxiety, and depression.  Also discusses healthy ways to reduce stress and to treat/manage anxiety and depression.  Written material given at graduation. Flowsheet Row Cardiac Rehab from 04/10/2022 in [facility] Cardiac [facility] Pul[facility] Rehab  Date  01/23/22  Educator JH  Instruction Review Code 1- [employer] SquibbVerbalizes Understanding       Education: Sleep Hygiene -Provides group verbal and written instruction about how sleep can affect your health.  Define sleep hygiene, discuss sleep cycles and impact of sleep habits. Review good sleep hygiene tips.    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 Yes   wife, friends     Barriers   Psychosocial barriers to participate in program There are no identifiable barriers or psychosocial needs.      Screening Interventions   Interventions Encouraged to exercise;To provide support and resources with identified psychosocial needs;Provide feedback about the scores to  participant    Expected Outcomes Short Term goal: Utilizing psychosocial counselor, staff and physician to assist with identification of specific Stressors or current issues interfering with healing process. Setting desired goal for each stressor or current issue identified.;Long Term Goal: Stressors or current issues are controlled or eliminated.;Short Term goal: Identification and review with participant of any Quality of Life or Depression concerns found by scoring the questionnaire.;Long Term goal: The participant improves quality of Life and PHQ9 Scores as seen by post scores and/or verbalization of changes             Quality of Life Scores:   Quality of Life - 04/08/22 0757       Quality of Life Scores   Health/Function Pre 29.83 %    Health/Function Post 28.6 %    Health/Function % Change -4.12 %    Socioeconomic Pre 30 %    Socioeconomic Post 29.38 %    Socioeconomic % Change  -2.07 %    Psych/Spiritual Pre 30 %    Psych/Spiritual Post 28.93 %    Psych/Spiritual % Change -3.57 %    Family Pre 30 %    Family Post 30 %    Family % Change 0 %    GLOBAL Pre 29.93 %    GLOBAL Post 29.04 %    GLOBAL % Change -2.97 %            Scores of 19 and below usually indicate a poorer quality of life in these areas.  A difference of  2-3 points is a clinically meaningful difference.  A difference of 2-3 points in the total score of the Quality of Life Index has been associated with significant improvement in overall quality of life, self-image, physical symptoms, and general health in studies assessing change in quality of life.  PHQ-9: Review Flowsheet       04/08/2022 12/20/2021  Depression screen PHQ 2/9  Decreased Interest 0 0  Down, Depressed, Hopeless 0 0  PHQ - 2 Score 0 0  Altered sleeping 0 0  Tired, decreased energy 0 0  Change in appetite 0 0  Feeling bad or failure about yourself  0 0  Trouble concentrating 0 0  Moving slowly or fidgety/restless 0 0  Suicidal  thoughts 0 0  PHQ-9 Score 0 0  Difficult doing work/chores - Not difficult at all   Interpretation of Total Score  Total Score Depression Severity:  1-4 = Minimal depression, 5-9 = Mild depression, 10-14 =  Moderate depression, 15-19 = Moderately severe depression, 20-27 = Severe depression   Psychosocial Evaluation and Intervention:  Psychosocial Evaluation - 12/14/21 1320       Psychosocial Evaluation & Interventions   Interventions Encouraged to exercise with the program and follow exercise prescription    Comments Jose Mckenzie has no barriers to attending the program. 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 This can be long hours and time at home to keep the class running. He plans to retire in Dec this year. He lives with his wife and she and some friends are his support. He wants to be able to get back to doing all his previous activities. He stated that his doctor told hm he will reach that goal.    Expected Outcomes STG: [place] will be able to attend all scheduled sessions, he will progress with his exercise. LTG Myshawn will continue his exercise progression after discharge    Continue Psychosocial Services  Follow up required by staff             Psychosocial Re-Evaluation:  Psychosocial Re-Evaluation     Row Name 01/14/22 [phone] 02/11/22 0937 03/22/22 0759 04/08/22 0801       Psychosocial Re-Evaluation   Current issues with Current Stress Concerns Current Stress Concerns Current Stress Concerns None Identified    Comments Patient reports no new changes in sleep, stress, or mental health. He report that his job can be stressful and he works long days, but he is active at work. He is planning to retire in December and will then be able to focus more on his exercise routine at home and managing his health. 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   He is excited for his team  comeptition as this is what they have worked towards for over the past year.  He is also working on getting set to retire at the end of December.  He is ready to retire but does not want to miss out on any of his final days. Jose Mckenzie returned today after being out since 11/13.  He is set to officially retire next Friday!!  His kids decorated his room yesterday at school to celebrate!  He has enjoyed this last days with his kids.  He has been happy and healthy for most part.  He is sleeping well and doing well mentally. Jose Mckenzie is officailly retired!  He is set to graduate this week.  He has enjoyed the program and realized that he can exercise with a treadmill and bike on cold days.  He is looking forward to getting back to his running on warmer days.    Expected Outcomes Short: Continue to attend cardiac rehab classes consistently and work towards retirement. Long: Continune good mental health habits. Conitnue to exercise for mental boost and work toward retirement Enjoy retirement!! Enjoy retirement!!    Interventions -- Encouraged to attend Cardiac Rehabilitation for the exercise Encouraged to attend Cardiac Rehabilitation for the exercise --    Continue Psychosocial Services  Follow up required by staff Follow up required by staff Follow up required by staff --             Psychosocial Discharge (Final Psychosocial Re-Evaluation):  Psychosocial Re-Evaluation - 04/08/22 0801       Psychosocial Re-Evaluation   Current issues with None Identified    Comments Jose Mckenzie is officailly  retired!  He is set to graduate this week.  He has enjoyed the program and realized that he can exercise with a treadmill and bike on cold days.  He is looking forward to getting back to his running on warmer days.    Expected Outcomes Enjoy retirement!!             Vocational Rehabilitation: Provide vocational rehab assistance to qualifying candidates.   Vocational Rehab Evaluation & Intervention:  Vocational Rehab -  12/20/21 1145       Initial Vocational Rehab Evaluation & Intervention   Assessment shows need for Vocational Rehabilitation No   Already returned to work            Education: Education Goals: Education classes will be provided on a variety of topics geared toward better understanding of heart health and risk factor modification. Participant will state understanding/return demonstration of topics presented as noted by education test scores.  Learning Barriers/Preferences:   General Cardiac Education Topics:  AED/CPR: - Group verbal and written instruction with the use of models to demonstrate the basic use of the AED with the basic ABC's of resuscitation.   Anatomy and Cardiac Procedures: - Group verbal and visual presentation and models provide information about basic cardiac anatomy and function. Reviews the testing methods done to diagnose heart disease and the outcomes of the test results. Describes the treatment choices: Medical Management, Angioplasty, or Coronary Bypass Surgery for treating various heart conditions including Myocardial Infarction, Angina, Valve Disease, and Cardiac Arrhythmias.  Written material given at graduation. Flowsheet Row Cardiac Rehab from 04/10/2022 in [facility] Cardiac and Pulmonary Rehab  Education need identified 12/20/21       Medication Safety: - Group verbal and visual instruction to review commonly prescribed medications for heart and lung disease. Reviews the medication, class of the drug, and side effects. Includes the steps to properly store meds and maintain the prescription regimen.  Written material given at graduation. Flowsheet Row Cardiac Rehab from 04/10/2022 in [facility] Cardiac and Pulmonary Rehab  Date 01/02/22  Educator SB  Instruction Review Code 1- Verbalizes Understanding       Intimacy: - Group verbal instruction through game format to discuss how heart and lung disease can affect sexual intimacy. Written material given at  graduation..   Know Your Numbers and Heart Failure: - Group verbal and visual instruction to discuss disease risk factors for cardiac and pulmonary disease and treatment options.  Reviews associated critical values for Overweight/Obesity, Hypertension, Cholesterol, and Diabetes.  Discusses basics of heart failure: signs/symptoms and treatments.  Introduces Heart Failure Zone chart for action plan for heart failure.  Written material given at graduation. Flowsheet Row Cardiac Rehab from 04/10/2022 in [facility] Cardiac and Pulmonary Rehab  Education need identified 12/20/21  Date 01/09/22  Educator SB  Instruction Review Code 1- Verbalizes Understanding       Infection Prevention: - Provides verbal and written material to individual with discussion of infection control including proper hand washing and proper equipment cleaning during exercise session. Flowsheet Row Cardiac Rehab from 04/10/2022 in [facility] Cardiac and Pulmonary Rehab  Date 12/20/21  Educator [facility]  Instruction Review Code 1- Verbalizes Understanding       Falls Prevention: - Provides verbal and written material to individual with discussion of falls prevention and safety. Flowsheet Row Cardiac Rehab from 04/10/2022 in [facility] Cardiac and Pulmonary Rehab  Date 12/14/21  Educator SB  Instruction Review Code 1- Verbalizes Understanding       Other: -Provides group  and verbal instruction on various topics (see comments)   Knowledge Questionnaire Score:  Knowledge Questionnaire Score - 04/08/22 0756       Knowledge Questionnaire Score   Pre Score 22/26    Post Score 25/26             Core Components/Risk Factors/Patient Goals at Admission:  Personal Goals and Risk Factors at Admission - 12/20/21 1148       Core Components/Risk Factors/Patient Goals on Admission    Weight Management Yes;Weight Loss    Intervention Weight Management: Develop a combined nutrition and exercise program designed to reach desired caloric  intake, while maintaining appropriate intake of nutrient and fiber, sodium and fats, and appropriate energy expenditure required for the weight goal.;Weight Management: Provide education and appropriate resources to help participant work on and attain dietary goals.    Admit Weight 185 lb 4.8 oz (84.1 kg)    Goal Weight: Short Term 180 lb (81.6 kg)    Goal Weight: Long Term 174 lb (78.9 kg)    Expected Outcomes Short Term: Continue to assess and modify interventions until short term weight is achieved;Long Term: Adherence to nutrition and physical activity/exercise program aimed toward attainment of established weight goal;Weight Loss: Understanding of general recommendations for a balanced deficit meal plan, which promotes 1-2 lb weight loss per week and includes a negative energy balance of [phone] kcal/d;Understanding recommendations for meals to include 15-35% energy as protein, 25-35% energy from fat, 35-60% energy from carbohydrates, less than  of dietary cholesterol, 20-35 gm of total fiber daily;Understanding of distribution of calorie intake throughout the day with the consumption of 4-5 meals/snacks    Hypertension Yes    Intervention Provide education on lifestyle modifcations including regular physical activity/exercise, weight management, moderate sodium restriction and increased consumption of fresh fruit, vegetables, and low fat dairy, alcohol moderation, and smoking cessation.;Monitor prescription use compliance.    Expected Outcomes Short Term: Continued assessment and intervention until BP is < 140/65mm HG in hypertensive participants. < 130/40mm HG in hypertensive participants with diabetes, heart failure or chronic kidney disease.;Long Term: Maintenance of blood pressure at goal levels.    Lipids Yes    Intervention Provide education and support for participant on nutrition & aerobic/resistive exercise along with prescribed medications to achieve LDL 70mg , HDL >40mg .    Expected  Outcomes Short Term: Participant states understanding of desired cholesterol values and is compliant with medications prescribed. Participant is following exercise prescription and nutrition guidelines.;Long Term: Cholesterol controlled with medications as prescribed, with individualized exercise RX and with personalized nutrition plan. Value goals: LDL < , HDL > 40 mg.             Education:Diabetes - Individual verbal and written instruction to review signs/symptoms of diabetes, desired ranges of glucose level fasting, after meals and with exercise. Acknowledge that pre and post exercise glucose checks will be done for 3 sessions at entry of program.   Core Components/Risk Factors/Patient Goals Review:   Goals and Risk Factor Review     Row Name 01/14/22 0725 02/11/22 0940 03/22/22 0802 04/08/22 0803       Core Components/Risk Factors/Patient Goals Review   Personal Goals Review Weight Management/Obesity;Hypertension;Lipids Weight Management/Obesity;Hypertension;Lipids Weight Management/Obesity;Hypertension;Lipids Weight Management/Obesity;Hypertension;Lipids    Review Patient reports that he takes all medications as prescribed. His blood pressures have been in acceptable ranges and his weight has been steady. He attends cardiac rehab regularly and continues in an active lifestyle to help maintain weight and control risk factors.  Jose Mckenzie is doing well in rehab.  His weight continues to stay steady.  His pressures are doing well.  He will be out until 12/20 due to school and work obligations and upcoming retirement. Jose Mckenzie returned to rehab today.  His weight is up some as he has not been as active do to his knee pain.  His pressures are doing well. Jose Mckenzie has doing well in rehab.  He is set to graduate this week.  His knee is doing better.  He has maintained his weight and pressures.  He is doing well on medicaitons.    Expected Outcomes Short: continue to attend cardiac rehab classes with  consistent attendance. Long: monitor and control cardiac risk factors with healthy lifestyle choices. Continue to monitor risk factors while out. Short; Get back to routine and lose weight Long: continue to monitor risk factors. Continue to monitor risk factors             Core Components/Risk Factors/Patient Goals at Discharge (Final Review):   Goals and Risk Factor Review - 04/08/22 0803       Core Components/Risk Factors/Patient Goals Review   Personal Goals Review Weight Management/Obesity;Hypertension;Lipids    Review Jose Mckenzie has doing well in rehab.  He is set to graduate this week.  His knee is doing better.  He has maintained his weight and pressures.  He is doing well on medicaitons.    Expected Outcomes Continue to monitor risk factors             ITP Comments:  ITP Comments     Row Name 12/14/21 1329 12/20/21 1136 12/24/21 0733 01/02/22 0656 01/30/22 0910   ITP Comments Virtual orientation call completed today. he has an appointment on Date: 12/20/2021  for EP eval and gym Orientation.  Documentation of diagnosis can be found in [facility] Date: 11/01/2021 . Completed and gym orientation. Initial ITP created and sent for review to Dr. Bethann Punches, Medical Director. First full day of exercise!  Patient was oriented to gym and equipment including functions, settings, policies, and procedures.  Patient's individual exercise prescription and treatment plan were reviewed.  All starting workloads were established based on the results of the 6 minute walk test done at initial orientation visit.  The plan for exercise progression was also introduced and progression will be customized based on patient's performance and goals. 30 Day review completed. Medical Director ITP review done, changes made as directed, and signed approval by Medical Director.   New to program 30 Day review completed. Medical Director ITP review done, changes made as directed, and signed approval by Medical Director.     Row Name 02/11/22 [phone] 02/27/22 0743 03/22/22 0755 03/27/22 0953 04/12/22 0806   ITP Comments Jose Mckenzie will be out until 12/20 due to other obligations with school and wrapping up to retirement. 30 Day review completed. Medical Director ITP review done, changes made as directed, and signed approval by Medical Director. Jose Mckenzie returned today after being out with school acitivties since 02/11/22. 30 Day review completed. Medical Director ITP review done, changes made as directed, and signed approval by Medical Director. Jose Mckenzie graduated today from  rehab with 36 sessions completed.  Details of the patient's exercise prescription and what He needs to do in order to continue the prescription and progress were discussed with patient.  Patient was given a copy of prescription and goals.  Patient verbalized understanding.  Jose Mckenzie plans to continue to exercise by running at home.  Comments: Discharge ITP

## 2022-04-12 NOTE — Progress Notes (Signed)
Discharge Note for Jose Mckenzie   DOB [birth year]   Rod graduated today from  rehab with 36 sessions completed.  Details of the patient's exercise prescription and what He needs to do in order to continue the prescription and progress were discussed with patient.  Patient was given a copy of prescription and goals.  Patient verbalized understanding.  Rod plans to continue to exercise by running at home.   [place] Name 12/20/21 1137 04/03/22 0736       6 Minute Walk   Phase Initial Discharge    Distance 1675 feet 1800 feet    Distance % Change -- 7.5 %    Distance Feet Change -- 125 ft    Walk Time 6 minutes 6 minutes    # of Rest Breaks 0 0    MPH 3.17 3.41    METS 3.69 3.91    RPE 7 11    Perceived Dyspnea  -- 0    VO2 Peak 12.87 13.69    Symptoms No No    Resting HR 57 bpm 61 bpm    Resting BP 128/64 126/76    Resting Oxygen Saturation  98 % 95 %    Exercise Oxygen Saturation  during 6 min walk 97 % 98 %    Max Ex. HR 93 bpm 90 bpm    Max Ex. BP 128/74 132/78    2 Minute Post BP 112/62 --            Thank you for the referral. We enjoyed working with Rod.

## 2022-12-04 ENCOUNTER — Encounter: Payer: Self-pay | Admitting: Ophthalmology

## 2022-12-10 NOTE — Discharge Instructions (Signed)

## 2022-12-12 ENCOUNTER — Ambulatory Visit
Admission: RE | Admit: 2022-12-12 | Discharge: 2022-12-12 | Disposition: A | Discharge status: Home / Self Care | Payer: Medicare PPO | Attending: Ophthalmology | Admitting: Ophthalmology

## 2022-12-12 ENCOUNTER — Encounter
Admission: RE | Disposition: A | Discharge status: Home / Self Care | Payer: Self-pay | Source: Home / Self Care | Attending: Ophthalmology

## 2022-12-12 ENCOUNTER — Other Ambulatory Visit: Payer: Self-pay

## 2022-12-12 ENCOUNTER — Encounter: Payer: Self-pay | Admitting: Ophthalmology

## 2022-12-12 ENCOUNTER — Ambulatory Visit: Payer: Medicare PPO | Admitting: Anesthesiology

## 2022-12-12 DIAGNOSIS — H2511 Age-related nuclear cataract, right eye: Secondary | ICD-10-CM | POA: Insufficient documentation

## 2022-12-12 HISTORY — DX: Cardiomyopathy, unspecified: I42.9

## 2022-12-12 HISTORY — DX: Essential (primary) hypertension: I10

## 2022-12-12 HISTORY — DX: Other complications of anesthesia, initial encounter: T88.59XA

## 2022-12-12 HISTORY — DX: Presence of coronary angioplasty implant and graft: Z95.5

## 2022-12-12 HISTORY — DX: Tachycardia, unspecified: R00.0

## 2022-12-12 HISTORY — DX: Ventricular premature depolarization: I49.3

## 2022-12-12 HISTORY — DX: Atherosclerotic heart disease of native coronary artery without angina pectoris: I25.10

## 2022-12-12 HISTORY — PX: CATARACT EXTRACTION W/PHACO: SHX586

## 2022-12-12 HISTORY — DX: Cardiac murmur, unspecified: R01.1

## 2022-12-12 SURGERY — PHACOEMULSIFICATION, CATARACT, WITH IOL INSERTION
Anesthesia: Monitor Anesthesia Care | Site: Eye | Laterality: Right

## 2022-12-12 MED ORDER — MOXIFLOXACIN HCL 0.5 % OP SOLN
OPHTHALMIC | Status: DC | PRN
  Administered 2022-12-12: .2 mL via OPHTHALMIC

## 2022-12-12 MED ORDER — TETRACAINE HCL 0.5 % OP SOLN
1.0000 [drp] | OPHTHALMIC | Status: DC | PRN
  Administered 2022-12-12 (×3): 1 [drp] via OPHTHALMIC

## 2022-12-12 MED ORDER — SIGHTPATH DOSE#1 BSS IO SOLN
INTRAOCULAR | Status: DC | PRN
  Administered 2022-12-12: 115 mL via OPHTHALMIC

## 2022-12-12 MED ORDER — BRIMONIDINE TARTRATE-TIMOLOL 0.2-0.5 % OP SOLN
OPHTHALMIC | Status: DC | PRN
  Administered 2022-12-12: 1 [drp] via OPHTHALMIC

## 2022-12-12 MED ORDER — LIDOCAINE HCL (PF) 2 % IJ SOLN
INTRAOCULAR | Status: DC | PRN
  Administered 2022-12-12: 4 mL via INTRAOCULAR

## 2022-12-12 MED ORDER — FENTANYL CITRATE (PF) 100 MCG/2ML IJ SOLN
INTRAMUSCULAR | Status: DC | PRN
  Administered 2022-12-12: 50 ug via INTRAVENOUS

## 2022-12-12 MED ORDER — SIGHTPATH DOSE#1 NA HYALUR & NA CHOND-NA HYALUR IO KIT
PACK | INTRAOCULAR | Status: DC | PRN
  Administered 2022-12-12: 1 via OPHTHALMIC

## 2022-12-12 MED ORDER — LACTATED RINGERS IV SOLN: INTRAVENOUS | Status: DC

## 2022-12-12 MED ORDER — SIGHTPATH DOSE#1 BSS IO SOLN
INTRAOCULAR | Status: DC | PRN
  Administered 2022-12-12: 15 mL via INTRAOCULAR

## 2022-12-12 MED ORDER — MIDAZOLAM HCL 2 MG/2ML IJ SOLN
INTRAMUSCULAR | Status: DC | PRN
  Administered 2022-12-12 (×2): 1 mg via INTRAVENOUS

## 2022-12-12 MED ORDER — ARMC OPHTHALMIC DILATING DROPS
1.0000 | OPHTHALMIC | Status: DC | PRN
  Administered 2022-12-12 (×3): 1 via OPHTHALMIC

## 2022-12-12 SURGICAL SUPPLY — 12 items
CANNULA ANT/CHMB 27G (MISCELLANEOUS) IMPLANT
CANNULA ANT/CHMB 27GA (MISCELLANEOUS)
CATARACT SUITE SIGHTPATH (MISCELLANEOUS) ×1
DISSECTOR HYDRO NUCLEUS 50X22 (MISCELLANEOUS) ×1 IMPLANT
DRSG TEGADERM 2-3/8X2-3/4 SM (GAUZE/BANDAGES/DRESSINGS) ×1 IMPLANT
FEE CATARACT SUITE SIGHTPATH (MISCELLANEOUS) ×1 IMPLANT
GLOVE SURG SYN 7.5 E (GLOVE) ×1
GLOVE SURG SYN 7.5 PF PI (GLOVE) ×1 IMPLANT
GLOVE SURG SYN 8.5 E (GLOVE) ×1
GLOVE SURG SYN 8.5 PF PI (GLOVE) ×1 IMPLANT
LENS CLAREON CLEAR VIVITY 11.5 ×1 IMPLANT
LENS IOL CLRN VT CLEAR 11.5 IMPLANT

## 2022-12-12 NOTE — Anesthesia Postprocedure Evaluation (Signed)
Anesthesia Post Note  Patient: Jose Mckenzie  Procedure(s) Performed: CATARACT EXTRACTION PHACO AND INTRAOCULAR LENS PLACEMENT (IOC) RIGHT  CLAREON VIVITY LENS 21.01 01:43.8 (Right: Eye)  Patient location during evaluation: PACU Anesthesia Type: MAC Level of consciousness: awake and alert Pain management: pain level controlled Vital Signs Assessment: post-procedure vital signs reviewed and stable Respiratory status: spontaneous breathing, nonlabored ventilation, respiratory function stable and patient connected to nasal cannula oxygen Cardiovascular status: blood pressure returned to baseline and stable Postop Assessment: no apparent nausea or vomiting Anesthetic complications: no  No notable events documented.   Last Vitals:  Vitals:   12/12/22 0945 12/12/22 0951  BP: 106/74   Pulse: (!) 54   Resp: 18   Temp: (!) 36.4 C 36.4 C  SpO2: 99%     Last Pain:  Vitals:   12/12/22 0951  TempSrc:   PainSc: 0-No pain                 Stephanie Coup

## 2022-12-12 NOTE — H&P (Signed)
[facility]   Primary Care Physician:  Dorothey Baseman, MD Ophthalmologist: Dr. Deberah Pelton  Pre-Procedure History & Physical: HPI:  Jose Mckenzie is a 67 y.o. male here for cataract surgery.   Past Medical History:  Diagnosis Date   Cardiomyopathy (HCC)    Complication of anesthesia    Rapid heart rate after hernia surgery   Coronary artery disease    Heart murmur    History of heart artery stent    Hx of dysplastic nevus 2015   multiple sites   Hypertension    PVC's (premature ventricular contractions)    Tachycardia     Past Surgical History:  Procedure Laterality Date   CORONARY STENT INTERVENTION N/A 11/01/2021   Procedure: CORONARY STENT INTERVENTION;  Surgeon: Alwyn Pea, MD;  Location: [facility] INVASIVE CV LAB;  Service: Cardiovascular;  Laterality: N/A;   HEMORRHOID SURGERY     HERNIA REPAIR  2015   LEFT HEART CATH AND CORONARY ANGIOGRAPHY N/A 11/01/2021   Procedure: LEFT HEART CATH AND CORONARY ANGIOGRAPHY;  Surgeon: Alwyn Pea, MD;  Location: [facility] INVASIVE CV LAB;  Service: Cardiovascular;  Laterality: N/A;    Prior to Admission medications   Medication Sig Start Date End Date Taking? Authorizing Provider  ASPIRIN 81 PO Take by mouth daily.   Yes [provider]  losartan (COZAAR) 50 MG tablet Take 1 tablet (50 mg total) by mouth daily. 11/03/21 12/04/22 Yes Callwood, Dwayne D, MD  metoprolol succinate (TOPROL XL) 25 MG 24 hr tablet Take 1 tablet (25 mg total) by mouth daily. 11/03/21 12/04/22 Yes Callwood, Dwayne D, MD  rosuvastatin (CRESTOR) 20 MG tablet Take 2 tablets (40 mg total) by mouth daily. 11/03/21 12/04/22 Yes Callwood, Dwayne D, MD  spironolactone (ALDACTONE) 25 MG tablet Take 12.5 mg by mouth daily. 11/04/21  Yes [provider]    Allergies as of 09/12/2022   (No Known Allergies)    History reviewed. No pertinent family history.  Social History   Socioeconomic History   Marital status: Married    Spouse name: Not  on file   Number of children: Not on file   Years of education: Not on file   Highest education level: Not on file  Occupational History   Not on file  Tobacco Use   Smoking status: Never    Passive exposure: Past   Smokeless tobacco: Never  Vaping Use   Vaping status: Never Used  Substance and Sexual Activity   Alcohol use: Yes    Alcohol/week: 1.0 standard drink of alcohol    Types: 1 Glasses of wine per week    Comment: occasionaly   Drug use: Never   Sexual activity: Not on file  Other Topics Concern   Not on file  Social History Narrative   Not on file   Social Determinants of Health   Financial Resource Strain: Low Risk  (10/25/2022)   Received from [facility]   Overall Financial Resource Strain (CARDIA)    Difficulty of Paying Living Expenses: Not hard at all  Food Insecurity: No Food Insecurity (10/25/2022)   Received from [facility]   Hunger Vital Sign    Worried About Running Out of Food in the Last Year: Never true    Ran Out of Food in the Last Year: Never true  Transportation Needs: No Transportation Needs (10/25/2022)   Received from [facility] - Transportation    In the past 12 months,  has lack of transportation kept you from medical appointments or from getting medications?: No    Lack of Transportation (Non-Medical): No  Physical Activity: Unknown (02/04/2017)   Received from [facility], [facility]   Exercise Vital Sign    Days of Exercise per Week: Patient declined    Minutes of Exercise per Session: Patient declined  Stress: Unknown (02/04/2017)   Received from [facility], [facility]   [employer] of Occupational Health - Occupational Stress Questionnaire    Feeling of Stress : Patient declined  Social Connections: Unknown (02/04/2017)   Received from [facility], [facility]   Social Connection and Isolation Panel [NHANES]    Frequency of Communication with Friends and Family: Patient declined    Frequency of Social Gatherings with Friends and Family: Patient declined    Attends Religious Services: Patient declined    Active Member of Clubs or Organizations: Patient declined    Attends Engineer, structural: Patient declined    Marital Status: Patient declined  Catering manager Violence: Not on file    Review of Systems: See HPI, otherwise negative ROS  Physical Exam: Ht 5\' 9"  (1.753 m)   Wt 81.6 kg   BMI 26.58 kg/m  General:   Alert, cooperative in NAD Head:  Normocephalic and atraumatic. Respiratory:  Normal work of breathing. Cardiovascular:  RRR  Impression/Plan: Jose Mckenzie is here for cataract surgery.  Risks, benefits, limitations, and alternatives regarding cataract surgery have been reviewed with the patient.  Questions have been answered.  All parties agreeable.   Estanislado Pandy, MD  12/12/2022, 7:10 AM

## 2022-12-12 NOTE — Op Note (Signed)
OPERATIVE NOTE  Jose Mckenzie [id number] 12/12/2022   PREOPERATIVE DIAGNOSIS: Nuclear sclerotic cataract right eye. H25.11   POSTOPERATIVE DIAGNOSIS: Nuclear sclerotic cataract right eye. H25.11   PROCEDURE:  Phacoemusification with posterior chamber intraocular lens placement of the right eye  Ultrasound time: Procedure(s): CATARACT EXTRACTION PHACO AND INTRAOCULAR LENS PLACEMENT (IOC) RIGHT  CLAREON VIVITY LENS 21.01 01:43.8 (Right)  LENS:   Implant Name Type Inv. Item Serial No. Manufacturer Lot No. LRB No. Used Action  LENS CLAREON CLEAR VIVITY 11.5 - [id number]  LENS CLAREON CLEAR VIVITY 11.5 [id number] [facility]  Right 1 Implanted      SURGEON:  Julious Payer. Rolley Sims, MD   ANESTHESIA:  Topical with tetracaine drops, augmented with 1% preservative-free intracameral lidocaine.   COMPLICATIONS:  None.   DESCRIPTION OF PROCEDURE:  The patient was identified in the holding room and transported to the operating room and placed in the supine position under the operating microscope.  The right eye was identified as the operative eye, which was prepped and draped in the usual sterile ophthalmic fashion.   A 1 millimeter clear-corneal paracentesis was made superotemporally. Preservative-free 1% lidocaine mixed with 1:1,000 bisulfite-free aqueous solution of epinephrine was injected into the anterior chamber. The anterior chamber was then filled with Viscoat viscoelastic. A 2.4 millimeter keratome was used to make a clear-corneal incision inferotemporally. A curvilinear capsulorrhexis was made with a cystotome and capsulorrhexis forceps. Balanced salt solution was used to hydrodissect and hydrodelineate the nucleus. Phacoemulsification was then used to remove the lens nucleus and epinucleus. The remaining cortex was then removed using the irrigation and aspiration handpiece. Provisc was then placed into the capsular bag to distend it for lens placement. A +11.50 D [id number] intraocular lens was then  injected into the capsular bag. The remaining viscoelastic was aspirated.   Wounds were hydrated with balanced salt solution.  The anterior chamber was inflated to a physiologic pressure with balanced salt solution.  No wound leaks were noted. Vigamox was injected intracamerally. Centration of the lens on the corneal light reflex was confirmed with Verion digitial display. Timolol and Brimonidine drops were applied to the eye.  The patient was taken to the recovery room in stable condition without complications of anesthesia or surgery.  [name] 12/12/2022, 9:45 AM

## 2022-12-12 NOTE — Transfer of Care (Signed)
Immediate Anesthesia Transfer of Care Note  Patient: Jose Mckenzie  Procedure(s) Performed: CATARACT EXTRACTION PHACO AND INTRAOCULAR LENS PLACEMENT (IOC) RIGHT  CLAREON VIVITY LENS 21.01 01:43.8 (Right: Eye)  Patient Location: PACU  Anesthesia Type:MAC  Level of Consciousness: awake, alert , and oriented  Airway & Oxygen Therapy: Patient Spontanous Breathing and Patient connected to nasal cannula oxygen  Post-op Assessment: Report given to RN, Post -op Vital signs reviewed and stable, and Patient moving all extremities X 4  Post vital signs: Reviewed and stable  Last Vitals:  Vitals Value Taken Time  BP    Temp    Pulse    Resp    SpO2      Last Pain:  Vitals:   12/12/22 0751  TempSrc: Temporal  PainSc: 0-No pain         Complications: No notable events documented.

## 2022-12-12 NOTE — Anesthesia Preprocedure Evaluation (Signed)
Anesthesia Evaluation  Patient identified by MRN, date of birth, ID band Patient awake    Reviewed: Allergy & Precautions, NPO status , Patient's Chart, lab work & pertinent test results  History of Anesthesia Complications (+) PONV and history of anesthetic complications  Airway Mallampati: III  TM Distance: >3 FB Neck ROM: full    Dental  (+) Chipped   Pulmonary neg pulmonary ROS   Pulmonary exam normal        Cardiovascular hypertension, On Medications + CAD  negative cardio ROS Normal cardiovascular exam     Neuro/Psych negative neurological ROS  negative psych ROS   GI/Hepatic negative GI ROS, Neg liver ROS,,,  Endo/Other  negative endocrine ROS    Renal/GU      Musculoskeletal   Abdominal   Peds  Hematology negative hematology ROS (+)   Anesthesia Other Findings Past Medical History: No date: Cardiomyopathy (HCC) No date: Complication of anesthesia     Comment:  Rapid heart rate after hernia surgery No date: Coronary artery disease No date: Heart murmur No date: History of heart artery stent 2015: Hx of dysplastic nevus     Comment:  multiple sites No date: Hypertension No date: PVC's (premature ventricular contractions) No date: Tachycardia  Past Surgical History: 11/01/2021: CORONARY STENT INTERVENTION; N/A     Comment:  Procedure: CORONARY STENT INTERVENTION;  Surgeon:               Alwyn Pea, MD;  Location: [facility] INVASIVE CV LAB;               Service: Cardiovascular;  Laterality: N/A; No date: HEMORRHOID SURGERY 2015: HERNIA REPAIR 11/01/2021: LEFT HEART CATH AND CORONARY ANGIOGRAPHY; N/A     Comment:  Procedure: LEFT HEART CATH AND CORONARY ANGIOGRAPHY;                Surgeon: Alwyn Pea, MD;  Location: [facility] INVASIVE              CV LAB;  Service: Cardiovascular;  Laterality: N/A;  BMI    Body Mass Index: 27.16 kg/m      Reproductive/Obstetrics negative OB ROS                              Anesthesia Physical Anesthesia Plan  ASA: 2  Anesthesia Plan: MAC   Post-op Pain Management:    Induction: Intravenous  PONV Risk Score and Plan: Midazolam and TIVA  Airway Management Planned: Natural Airway and Nasal Cannula  Additional Equipment:   Intra-op Plan:   Post-operative Plan:   Informed Consent: I have reviewed the patients History and Physical, chart, labs and discussed the procedure including the risks, benefits and alternatives for the proposed anesthesia with the patient or authorized representative who has indicated his/her understanding and acceptance.     Dental Advisory Given  Plan Discussed with: Anesthesiologist, CRNA and Surgeon  Anesthesia Plan Comments: (Patient consented for risks of anesthesia including but not limited to:  - adverse reactions to medications - damage to eyes, teeth, lips or other oral mucosa - nerve damage due to positioning  - sore throat or hoarseness - Damage to heart, brain, nerves, lungs, other parts of body or loss of life  Patient voiced understanding.)       Anesthesia Quick Evaluation

## 2022-12-17 NOTE — Anesthesia Preprocedure Evaluation (Addendum)
Anesthesia Evaluation  Patient identified by MRN, date of birth, ID band Patient awake    Reviewed: Allergy & Precautions, H&P , NPO status , Patient's Chart, lab work & pertinent test results  History of Anesthesia Complications (+) history of anesthetic complications  Airway Mallampati: III  TM Distance: >3 FB Neck ROM: Full    Dental no notable dental hx.    Pulmonary neg pulmonary ROS   Pulmonary exam normal breath sounds clear to auscultation       Cardiovascular hypertension, + CAD  negative cardio ROS Normal cardiovascular exam+ Valvular Problems/Murmurs  Rhythm:Regular Rate:Normal  Office visit 09-09-22, completed 5K  PMH significant for heart murmur, hemorrhoids, palpitations, PVC, and tachycardia.   Neuro/Psych negative neurological ROS  negative psych ROS   GI/Hepatic negative GI ROS, Neg liver ROS,,,  Endo/Other  negative endocrine ROS    Renal/GU negative Renal ROS  negative genitourinary   Musculoskeletal negative musculoskeletal ROS (+)    Abdominal   Peds negative pediatric ROS (+)  Hematology negative hematology ROS (+)   Anesthesia Other Findings Previous cataract surgery 12-12-22 Dr. Lorette Ang   Hx of dysplastic nevus  Hx ischemic cardiomyopathy Hypertension Coronary artery disease  Complication of anesthesia---rapid heart rate after hernia surgery Heart murmur  PVC's (premature ventricular contractions) Tachycardia  History of heart artery stent Cardiomyopathy (HCC)     Reproductive/Obstetrics negative OB ROS                              Anesthesia Physical Anesthesia Plan  ASA: 3  Anesthesia Plan: MAC   Post-op Pain Management:    Induction: Intravenous  PONV Risk Score and Plan:   Airway Management Planned: Natural Airway and Nasal Cannula  Additional Equipment:   Intra-op Plan:   Post-operative Plan:   Informed Consent: I have reviewed  the patients History and Physical, chart, labs and discussed the procedure including the risks, benefits and alternatives for the proposed anesthesia with the patient or authorized representative who has indicated his/her understanding and acceptance.     Dental Advisory Given  Plan Discussed with: Anesthesiologist, CRNA and Surgeon  Anesthesia Plan Comments: (Patient consented for risks of anesthesia including but not limited to:  - adverse reactions to medications - damage to eyes, teeth, lips or other oral mucosa - nerve damage due to positioning  - sore throat or hoarseness - Damage to heart, brain, nerves, lungs, other parts of body or loss of life  Patient voiced understanding.)         Anesthesia Quick Evaluation

## 2022-12-24 NOTE — Discharge Instructions (Signed)

## 2022-12-26 ENCOUNTER — Encounter
Admission: RE | Disposition: A | Discharge status: Home / Self Care | Payer: Self-pay | Source: Home / Self Care | Attending: Ophthalmology

## 2022-12-26 ENCOUNTER — Ambulatory Visit: Payer: Medicare PPO | Admitting: Anesthesiology

## 2022-12-26 ENCOUNTER — Encounter: Payer: Self-pay | Admitting: Ophthalmology

## 2022-12-26 ENCOUNTER — Ambulatory Visit
Admission: RE | Admit: 2022-12-26 | Discharge: 2022-12-26 | Disposition: A | Discharge status: Home / Self Care | Payer: Medicare PPO | Attending: Ophthalmology | Admitting: Ophthalmology

## 2022-12-26 ENCOUNTER — Other Ambulatory Visit: Payer: Self-pay

## 2022-12-26 DIAGNOSIS — R002 Palpitations: Secondary | ICD-10-CM | POA: Diagnosis not present

## 2022-12-26 DIAGNOSIS — I255 Ischemic cardiomyopathy: Secondary | ICD-10-CM | POA: Diagnosis not present

## 2022-12-26 DIAGNOSIS — I10 Essential (primary) hypertension: Secondary | ICD-10-CM | POA: Insufficient documentation

## 2022-12-26 DIAGNOSIS — R011 Cardiac murmur, unspecified: Secondary | ICD-10-CM | POA: Insufficient documentation

## 2022-12-26 DIAGNOSIS — H2512 Age-related nuclear cataract, left eye: Secondary | ICD-10-CM | POA: Insufficient documentation

## 2022-12-26 DIAGNOSIS — I2510 Atherosclerotic heart disease of native coronary artery without angina pectoris: Secondary | ICD-10-CM | POA: Insufficient documentation

## 2022-12-26 DIAGNOSIS — R000 Tachycardia, unspecified: Secondary | ICD-10-CM | POA: Insufficient documentation

## 2022-12-26 HISTORY — PX: CATARACT EXTRACTION W/PHACO: SHX586

## 2022-12-26 SURGERY — PHACOEMULSIFICATION, CATARACT, WITH IOL INSERTION
Anesthesia: Monitor Anesthesia Care | Laterality: Left

## 2022-12-26 MED ORDER — MIDAZOLAM HCL 2 MG/2ML IJ SOLN
INTRAMUSCULAR | Status: AC
  Filled 2022-12-26: qty 2

## 2022-12-26 MED ORDER — BRIMONIDINE TARTRATE-TIMOLOL 0.2-0.5 % OP SOLN
OPHTHALMIC | Status: DC | PRN
  Administered 2022-12-26: 1 [drp] via OPHTHALMIC

## 2022-12-26 MED ORDER — MOXIFLOXACIN HCL 0.5 % OP SOLN
OPHTHALMIC | Status: DC | PRN
  Administered 2022-12-26: .2 mL via OPHTHALMIC

## 2022-12-26 MED ORDER — LIDOCAINE HCL (PF) 2 % IJ SOLN
INTRAOCULAR | Status: DC | PRN
  Administered 2022-12-26: 1 mL via INTRAOCULAR

## 2022-12-26 MED ORDER — SIGHTPATH DOSE#1 NA HYALUR & NA CHOND-NA HYALUR IO KIT
PACK | INTRAOCULAR | Status: DC | PRN
  Administered 2022-12-26: 1 via OPHTHALMIC

## 2022-12-26 MED ORDER — ARMC OPHTHALMIC DILATING DROPS
OPHTHALMIC | Status: AC
  Filled 2022-12-26: qty 0.5

## 2022-12-26 MED ORDER — SIGHTPATH DOSE#1 BSS IO SOLN
INTRAOCULAR | Status: DC | PRN
  Administered 2022-12-26: 15 mL via INTRAOCULAR

## 2022-12-26 MED ORDER — SIGHTPATH DOSE#1 BSS IO SOLN
INTRAOCULAR | Status: DC | PRN
  Administered 2022-12-26: 89 mL via OPHTHALMIC

## 2022-12-26 MED ORDER — TETRACAINE HCL 0.5 % OP SOLN
OPHTHALMIC | Status: AC
  Filled 2022-12-26: qty 4

## 2022-12-26 MED ORDER — LACTATED RINGERS IV SOLN: INTRAVENOUS | Status: DC

## 2022-12-26 MED ORDER — MIDAZOLAM HCL 5 MG/5ML IJ SOLN
INTRAMUSCULAR | Status: DC | PRN
  Administered 2022-12-26: 2 mg via INTRAVENOUS

## 2022-12-26 MED ORDER — ARMC OPHTHALMIC DILATING DROPS
1.0000 | OPHTHALMIC | Status: DC | PRN
  Administered 2022-12-26 (×3): 1 via OPHTHALMIC

## 2022-12-26 MED ORDER — TETRACAINE HCL 0.5 % OP SOLN
1.0000 [drp] | OPHTHALMIC | Status: DC | PRN
  Administered 2022-12-26 (×3): 1 [drp] via OPHTHALMIC

## 2022-12-26 SURGICAL SUPPLY — 15 items
BNDG EYE OVAL 2 1/8 X 2 5/8 (GAUZE/BANDAGES/DRESSINGS) IMPLANT
CANNULA ANT/CHMB 27G (MISCELLANEOUS) IMPLANT
CANNULA ANT/CHMB 27GA (MISCELLANEOUS)
CATARACT SUITE SIGHTPATH (MISCELLANEOUS) ×1
DISSECTOR HYDRO NUCLEUS 50X22 (MISCELLANEOUS) ×1 IMPLANT
DRSG TEGADERM 2-3/8X2-3/4 SM (GAUZE/BANDAGES/DRESSINGS) ×1 IMPLANT
FEE CATARACT SUITE SIGHTPATH (MISCELLANEOUS) ×1 IMPLANT
GLOVE SURG SYN 7.5 E (GLOVE) ×1
GLOVE SURG SYN 7.5 PF PI (GLOVE) ×1 IMPLANT
GLOVE SURG SYN 8.5 E (GLOVE) ×1
GLOVE SURG SYN 8.5 PF PI (GLOVE) ×1 IMPLANT
LENS ENVISTA ASPIRE TORIC 19 - S3Q02803030 ×1 IMPLANT
LENS ENVISTA ASPIRE TORIC 8.5 ×2 IMPLANT
LENS IOL ENVISTA ASPR TRC 8.5 IMPLANT
SYR 3ML LL SCALE MARK (SYRINGE) IMPLANT

## 2022-12-26 NOTE — H&P (Signed)
[facility]   Primary Care Physician:  Dorothey Baseman, MD Ophthalmologist: Dr. Deberah Pelton  Pre-Procedure History & Physical: HPI:  Jose Mckenzie is a 67 y.o. male here for cataract surgery.   Past Medical History:  Diagnosis Date   Cardiomyopathy (HCC)    Complication of anesthesia    Rapid heart rate after hernia surgery   Coronary artery disease    Heart murmur    History of heart artery stent    Hx of dysplastic nevus 2015   multiple sites   Hypertension    PVC's (premature ventricular contractions)    Tachycardia     Past Surgical History:  Procedure Laterality Date   CATARACT EXTRACTION W/PHACO Right 12/12/2022   Procedure: CATARACT EXTRACTION PHACO AND INTRAOCULAR LENS PLACEMENT (IOC) RIGHT  CLAREON VIVITY LENS 21.01 01:43.8;  Surgeon: Estanislado Pandy, MD;  [facility];  Service: Ophthalmology;  Laterality: Right;   CORONARY STENT INTERVENTION N/A 11/01/2021   Procedure: CORONARY STENT INTERVENTION;  Surgeon: Alwyn Pea, MD;  Location: [facility] INVASIVE CV LAB;  Service: Cardiovascular;  Laterality: N/A;   HEMORRHOID SURGERY     HERNIA REPAIR  2015   LEFT HEART CATH AND CORONARY ANGIOGRAPHY N/A 11/01/2021   Procedure: LEFT HEART CATH AND CORONARY ANGIOGRAPHY;  Surgeon: Alwyn Pea, MD;  Location: [facility] INVASIVE CV LAB;  Service: Cardiovascular;  Laterality: N/A;    Prior to Admission medications   Medication Sig Start Date End Date Taking? Authorizing Provider  ASPIRIN 81 PO Take by mouth daily.   Yes [provider]  losartan (COZAAR) 50 MG tablet Take 1 tablet (50 mg total) by mouth daily. 11/03/21 12/26/22 Yes Callwood, Dwayne D, MD  metoprolol succinate (TOPROL XL) 25 MG 24 hr tablet Take 1 tablet (25 mg total) by mouth daily. 11/03/21 12/26/22 Yes Callwood, Dwayne D, MD  rosuvastatin (CRESTOR) 20 MG tablet Take 2 tablets (40 mg total) by mouth daily. 11/03/21 12/26/22 Yes Callwood, Dwayne D, MD  spironolactone  (ALDACTONE) 25 MG tablet Take 12.5 mg by mouth daily. 11/04/21  Yes [provider]    Allergies as of 09/12/2022   (No Known Allergies)    History reviewed. No pertinent family history.  Social History   Socioeconomic History   Marital status: Married    Spouse name: Not on file   Number of children: Not on file   Years of education: Not on file   Highest education level: Not on file  Occupational History   Not on file  Tobacco Use   Smoking status: Never    Passive exposure: Past   Smokeless tobacco: Never  Vaping Use   Vaping status: Never Used  Substance and Sexual Activity   Alcohol use: Yes    Alcohol/week: 1.0 standard drink of alcohol    Types: 1 Glasses of wine per week    Comment: occasionaly   Drug use: Never   Sexual activity: Not on file  Other Topics Concern   Not on file  Social History Narrative   Not on file   Social Determinants of Health   Financial Resource Strain: Low Risk  (10/25/2022)   Received from [facility]   Overall Financial Resource Strain (CARDIA)    Difficulty of Paying Living Expenses: Not hard at all  Food Insecurity: No Food Insecurity (10/25/2022)   Received from [facility]   Hunger Vital Sign    Worried About Running Out of Food in the Last Year: Never  true    Ran Out of Food in the Last Year: Never true  Transportation Needs: No Transportation Needs (10/25/2022)   Received from [facility] - [facility]    In the past 12 months, has lack of transportation kept you from medical appointments or from getting medications?: No    Lack of Transportation (Non-Medical): No  Physical Activity: Unknown (02/04/2017)   Received from [facility], [facility]   Exercise Vital Sign    Days of Exercise per Week: Patient declined    Minutes of Exercise per Session: Patient declined  Stress: Unknown (02/04/2017)   Received from [facility], [facility]   [employer] of Occupational Health - Occupational Stress Questionnaire    Feeling of Stress : Patient declined  Social Connections: Unknown (02/04/2017)   Received from [facility], [facility]   Social Connection and Isolation Panel [NHANES]    Frequency of Communication with Friends and Family: Patient declined    Frequency of Social Gatherings with Friends and Family: Patient declined    Attends Religious Services: Patient declined    Active Member of Clubs or Organizations: Patient declined    Attends Engineer, structural: Patient declined    Marital Status: Patient declined  Catering manager Violence: Not on file    Review of Systems: See HPI, otherwise negative ROS  Physical Exam: BP 117/88   Pulse (!) 58   Temp (!) 97.5 F (36.4 C) (Temporal)   Resp (!) 9   Ht 5' 9.02" (1.753 m)   Wt 81.6 kg   SpO2 99%   BMI 26.57 kg/m  General:   Alert, cooperative in NAD Head:  Normocephalic and atraumatic. Respiratory:  Normal work of breathing. Cardiovascular:  RRR  Impression/Plan: Jose Mckenzie is here for cataract surgery.  Risks, benefits, limitations, and alternatives regarding cataract surgery have been reviewed with the patient.  Questions have been answered.  All parties agreeable.   Estanislado Pandy, MD  12/26/2022, 7:02 AM

## 2022-12-26 NOTE — Op Note (Signed)
OPERATIVE NOTE  Jose Mckenzie [id number] 12/26/2022   PREOPERATIVE DIAGNOSIS: Nuclear sclerotic cataract left eye. H25.12   POSTOPERATIVE DIAGNOSIS: Nuclear sclerotic cataract left eye. H25.12   PROCEDURE:  Phacoemusification with Toric posterior chamber intraocular lens placement of the left eye  Ultrasound time: Procedure(s): CATARACT EXTRACTION PHACO AND INTRAOCULAR LENS PLACEMENT (IOC) LEFT ENVISTA ESPIRE TORIC LENS 16.54, 01:24.5 (Left)  LENS:   Implant Name Type Inv. Item Serial No. Manufacturer Lot No. LRB No. Used Action  ENVISTA ASPIRE TORIC Intraocular Lens    [id number] Left 1 Implanted    EA Toric intraocular lens with 1.25 diopters of cylindrical power with axis orientation at 150 degrees.  SURGEON:  Julious Payer. Rolley Sims, MD   ANESTHESIA:  Topical with tetracaine drops, augmented with 1% preservative-free intracameral lidocaine.   COMPLICATIONS:  None.   DESCRIPTION OF PROCEDURE:  The patient was identified in the holding room and transported to the operating room and placed in the supine position under the operating microscope.  The left eye was identified as the operative eye, which was prepped and draped in the usual sterile ophthalmic fashion.   A 1 millimeter clear-corneal paracentesis was made inferotemporally. Preservative-free 1% lidocaine mixed with 1:1,000 bisulfite-free aqueous solution of epinephrine was injected into the anterior chamber. The anterior chamber was then filled with Viscoat viscoelastic. A 2.4 millimeter keratome was used to make a clear-corneal incision superotemporally. A curvilinear capsulorrhexis was made with a cystotome and capsulorrhexis forceps. Balanced salt solution was used to hydrodissect and hydrodelineate the nucleus. Phacoemulsification was then used to remove the lens nucleus and epinucleus. The remaining cortex was then removed using the irrigation and aspiration handpiece. Provisc was then placed into the capsular bag to distend it for  lens placement. The Verion digital marker was used to align the implant at the intended axis.  A +8.50 D [id number] Toric lens was then injected into the capsular bag.  It was rotated clockwise until the axis marks on the lens were approximately 15 degrees in the counterclockwise direction to the intended alignment.  The viscoelastic was aspirated from the eye using the irrigation aspiration handpiece.  Then, a blunt chopper through the sideport incision was used to rotate the lens in a clockwise direction until the axis markings of the intraocular lens were lined up with the Verion alignment.  Balanced salt solution was then used to hydrate the wounds.   The anterior chamber was inflated to a physiologic pressure with balanced salt solution.  No wound leaks were noted. Moxifloxacin was injected intracamerally.  Timolol and Brimonidine drops were applied to the eye.  The patient was taken to the recovery room in stable condition without complications of anesthesia or surgery.  [name] [place] 12/26/2022, 8:04 AM

## 2022-12-26 NOTE — Anesthesia Postprocedure Evaluation (Signed)
Anesthesia Post Note  Patient: Jose Mckenzie  Procedure(s) Performed: CATARACT EXTRACTION PHACO AND INTRAOCULAR LENS PLACEMENT (IOC) LEFT ENVISTA ESPIRE TORIC LENS 16.54, 01:24.5 (Left)  Patient location during evaluation: PACU Anesthesia Type: MAC Level of consciousness: awake and alert Pain management: pain level controlled Vital Signs Assessment: post-procedure vital signs reviewed and stable Respiratory status: spontaneous breathing, nonlabored ventilation, respiratory function stable and patient connected to nasal cannula oxygen Cardiovascular status: stable and blood pressure returned to baseline Postop Assessment: no apparent nausea or vomiting Anesthetic complications: no   No notable events documented.   Last Vitals:  Vitals:   12/26/22 0806 12/26/22 0814  BP: 107/87 115/81  Pulse: (!) 56 (!) 55  Resp: 17 15  Temp: (!) 36.2 C   SpO2: 99% 95%    Last Pain:  Vitals:   12/26/22 0814  TempSrc:   PainSc: 0-No pain                 Marisue Humble

## 2022-12-26 NOTE — Transfer of Care (Signed)
Immediate Anesthesia Transfer of Care Note  Patient: Jose Mckenzie  Procedure(s) Performed: CATARACT EXTRACTION PHACO AND INTRAOCULAR LENS PLACEMENT (IOC) LEFT ENVISTA ESPIRE TORIC LENS 16.54, 01:24.5 (Left)  Patient Location: PACU  Anesthesia Type: MAC  Level of Consciousness: awake, alert  and patient cooperative  Airway and Oxygen Therapy: Patient Spontanous Breathing and Patient connected to supplemental oxygen  Post-op Assessment: Post-op Vital signs reviewed, Patient's Cardiovascular Status Stable, Respiratory Function Stable, Patent Airway and No signs of Nausea or vomiting  Post-op Vital Signs: Reviewed and stable  Complications: No notable events documented.

## 2023-01-02 ENCOUNTER — Encounter: Payer: Self-pay | Admitting: Ophthalmology

## 2023-01-06 ENCOUNTER — Encounter: Payer: Self-pay | Admitting: Ophthalmology

## 2023-01-08 ENCOUNTER — Encounter: Payer: Self-pay | Admitting: Ophthalmology

## 2024-03-11 ENCOUNTER — Other Ambulatory Visit: Payer: Self-pay | Admitting: Internal Medicine

## 2024-03-11 DIAGNOSIS — I447 Left bundle-branch block, unspecified: Secondary | ICD-10-CM

## 2024-03-11 DIAGNOSIS — I255 Ischemic cardiomyopathy: Secondary | ICD-10-CM

## 2024-03-11 DIAGNOSIS — I509 Heart failure, unspecified: Secondary | ICD-10-CM

## 2024-04-09 ENCOUNTER — Encounter (HOSPITAL_COMMUNITY): Payer: Self-pay

## 2024-04-12 ENCOUNTER — Encounter: Payer: Self-pay | Admitting: Internal Medicine

## 2024-04-12 ENCOUNTER — Ambulatory Visit
Admission: RE | Admit: 2024-04-12 | Discharge: 2024-04-12 | Disposition: A | Discharge status: Home / Self Care | Source: Ambulatory Visit | Attending: Internal Medicine | Admitting: Internal Medicine

## 2024-04-12 ENCOUNTER — Other Ambulatory Visit: Payer: Self-pay | Admitting: Internal Medicine

## 2024-04-12 DIAGNOSIS — I447 Left bundle-branch block, unspecified: Secondary | ICD-10-CM

## 2024-04-12 DIAGNOSIS — I255 Ischemic cardiomyopathy: Secondary | ICD-10-CM | POA: Insufficient documentation

## 2024-04-12 DIAGNOSIS — I509 Heart failure, unspecified: Secondary | ICD-10-CM | POA: Diagnosis not present

## 2024-04-12 MED ORDER — GADOBUTROL 1 MMOL/ML IV SOLN
11.0000 mL | Freq: Once | INTRAVENOUS | Status: AC | PRN
  Administered 2024-04-12: 11 mL via INTRAVENOUS

## 2024-04-21 ENCOUNTER — Other Ambulatory Visit
